# Patient Record
Sex: Male | Born: 1975 | Race: White | Hispanic: No | Smoking: Former smoker
Health system: Southern US, Community
[De-identification: ages and names within clinical notes are randomized; demographics above are authoritative.]

## PROBLEM LIST (undated history)

## (undated) DIAGNOSIS — K589 Irritable bowel syndrome without diarrhea: Secondary | ICD-10-CM

## (undated) DIAGNOSIS — K219 Gastro-esophageal reflux disease without esophagitis: Secondary | ICD-10-CM

## (undated) DIAGNOSIS — F32A Depression, unspecified: Secondary | ICD-10-CM

## (undated) DIAGNOSIS — K750 Abscess of liver: Secondary | ICD-10-CM

## (undated) DIAGNOSIS — F4310 Post-traumatic stress disorder, unspecified: Secondary | ICD-10-CM

## (undated) DIAGNOSIS — F419 Anxiety disorder, unspecified: Secondary | ICD-10-CM

## (undated) DIAGNOSIS — F329 Major depressive disorder, single episode, unspecified: Secondary | ICD-10-CM

## (undated) DIAGNOSIS — G4733 Obstructive sleep apnea (adult) (pediatric): Secondary | ICD-10-CM

## (undated) HISTORY — DX: Irritable bowel syndrome without diarrhea: K58.9

## (undated) HISTORY — DX: Major depressive disorder, single episode, unspecified: F32.9

## (undated) HISTORY — PX: LIVER SURGERY: SHX698

## (undated) HISTORY — DX: Obstructive sleep apnea (adult) (pediatric): G47.33

## (undated) HISTORY — DX: Morbid (severe) obesity due to excess calories: E66.01

## (undated) HISTORY — DX: Post-traumatic stress disorder, unspecified: F43.10

## (undated) HISTORY — DX: Depression, unspecified: F32.A

## (undated) HISTORY — DX: Gastro-esophageal reflux disease without esophagitis: K21.9

## (undated) HISTORY — DX: Anxiety disorder, unspecified: F41.9

---
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
## 2009-11-18 ENCOUNTER — Emergency Department (HOSPITAL_COMMUNITY): Admission: EM | Admit: 2009-11-18 | Discharge: 2009-11-18 | Payer: Self-pay | Admitting: Family Medicine

## 2011-01-15 ENCOUNTER — Emergency Department (HOSPITAL_COMMUNITY): Payer: Self-pay

## 2011-01-15 ENCOUNTER — Emergency Department (HOSPITAL_COMMUNITY)
Admission: EM | Admit: 2011-01-15 | Discharge: 2011-01-15 | Disposition: A | Discharge status: Home / Self Care | Payer: Self-pay | Attending: Emergency Medicine | Admitting: Emergency Medicine

## 2011-01-15 DIAGNOSIS — K589 Irritable bowel syndrome without diarrhea: Secondary | ICD-10-CM | POA: Insufficient documentation

## 2011-01-15 DIAGNOSIS — F329 Major depressive disorder, single episode, unspecified: Secondary | ICD-10-CM | POA: Insufficient documentation

## 2011-01-15 DIAGNOSIS — R109 Unspecified abdominal pain: Secondary | ICD-10-CM | POA: Insufficient documentation

## 2011-01-15 DIAGNOSIS — F3289 Other specified depressive episodes: Secondary | ICD-10-CM | POA: Insufficient documentation

## 2011-01-15 DIAGNOSIS — F411 Generalized anxiety disorder: Secondary | ICD-10-CM | POA: Insufficient documentation

## 2011-01-15 DIAGNOSIS — M546 Pain in thoracic spine: Secondary | ICD-10-CM | POA: Insufficient documentation

## 2011-01-15 DIAGNOSIS — R197 Diarrhea, unspecified: Secondary | ICD-10-CM | POA: Insufficient documentation

## 2011-01-15 DIAGNOSIS — R12 Heartburn: Secondary | ICD-10-CM | POA: Insufficient documentation

## 2011-01-15 DIAGNOSIS — R112 Nausea with vomiting, unspecified: Secondary | ICD-10-CM | POA: Insufficient documentation

## 2011-01-15 LAB — DIFFERENTIAL
Basophils Absolute: 0 10*3/uL (ref 0.0–0.1)
Basophils Relative: 0 % (ref 0–1)
Monocytes Absolute: 1.6 10*3/uL — ABNORMAL HIGH (ref 0.1–1.0)
Neutro Abs: 7.1 10*3/uL (ref 1.7–7.7)
Neutrophils Relative %: 53 % (ref 43–77)

## 2011-01-15 LAB — COMPREHENSIVE METABOLIC PANEL
ALT: 31 U/L (ref 0–53)
AST: 34 U/L (ref 0–37)
Albumin: 4.7 g/dL (ref 3.5–5.2)
CO2: 27 mEq/L (ref 19–32)
Calcium: 10 mg/dL (ref 8.4–10.5)
GFR calc Af Amer: >60 mL/min (ref >60)
GFR calc non Af Amer: >60 mL/min (ref >60)
Sodium: 137 mEq/L (ref 135–145)

## 2011-01-15 LAB — URINALYSIS, ROUTINE W REFLEX MICROSCOPIC
Nitrite: NEGATIVE
Specific Gravity, Urine: 1.036 — ABNORMAL HIGH (ref 1.005–1.030)
Urobilinogen, UA: 0.2 mg/dL (ref 0.0–1.0)

## 2011-01-15 LAB — CBC
Hemoglobin: 16.1 g/dL (ref 13.0–17.0)
MCHC: 34.8 g/dL (ref 30.0–36.0)
Platelets: 209 10*3/uL (ref 150–400)
RBC: 5.24 MIL/uL (ref 4.22–5.81)

## 2013-06-30 ENCOUNTER — Encounter: Payer: Self-pay | Admitting: Family Medicine

## 2013-06-30 ENCOUNTER — Ambulatory Visit (INDEPENDENT_AMBULATORY_CARE_PROVIDER_SITE_OTHER): Payer: No Typology Code available for payment source | Admitting: Family Medicine

## 2013-06-30 VITALS — BP 120/70 | HR 67 | Temp 97.9°F | Ht 69.0 in | Wt 294.8 lb

## 2013-06-30 DIAGNOSIS — Z13220 Encounter for screening for lipoid disorders: Secondary | ICD-10-CM

## 2013-06-30 DIAGNOSIS — G4733 Obstructive sleep apnea (adult) (pediatric): Secondary | ICD-10-CM

## 2013-06-30 DIAGNOSIS — K589 Irritable bowel syndrome without diarrhea: Secondary | ICD-10-CM

## 2013-06-30 DIAGNOSIS — K219 Gastro-esophageal reflux disease without esophagitis: Secondary | ICD-10-CM

## 2013-06-30 DIAGNOSIS — R5381 Other malaise: Secondary | ICD-10-CM

## 2013-06-30 DIAGNOSIS — F259 Schizoaffective disorder, unspecified: Secondary | ICD-10-CM

## 2013-06-30 LAB — CBC WITH DIFFERENTIAL/PLATELET
Basophils Absolute: 0 10*3/uL (ref 0.0–0.1)
Eosinophils Absolute: 0.3 10*3/uL (ref 0.0–0.7)
HCT: 42.3 % (ref 39.0–52.0)
Lymphs Abs: 3.9 10*3/uL (ref 0.7–4.0)
MCHC: 33.8 g/dL (ref 30.0–36.0)
MCV: 88.6 fl (ref 78.0–100.0)
Monocytes Absolute: 0.7 10*3/uL (ref 0.1–1.0)
Neutrophils Relative %: 54.5 % (ref 43.0–77.0)
Platelets: 249 10*3/uL (ref 150.0–400.0)
RDW: 14.1 % (ref 11.5–14.6)
WBC: 10.8 10*3/uL — ABNORMAL HIGH (ref 4.5–10.5)

## 2013-06-30 NOTE — Patient Instructions (Addendum)
REFERRAL: GO THE THE FRONT ROOM AT THE ENTRANCE OF OUR CLINIC, NEAR CHECK IN. ASK FOR Nicholas Bradford. SHE WILL HELP YOU SET UP YOUR REFERRAL. DATE: TIME:  

## 2013-06-30 NOTE — Progress Notes (Signed)
Nature conservation officer at [facility] [address] [place] [address] Phone: [phone] Fax: [phone]  Date:  06/30/2013   Name:  Nicholas Bradford   DOB:  [birth year]   MRN:  [id number] Gender: male Age: 37 y.o.  Primary Physician:  Hannah Beat, MD  Evaluating MD: Hannah Beat, MD   Chief Complaint: New Patient   History of Present Illness:  Nicholas Bradford is a 37 y.o. pleasant patient who presents with the following:  New patient:   Has been sick a lot and throwing up and ssleeping fairly poorly. Does have some IBS and it has been pretty intense. Was diagnosed with sleep apnea, and how to get osa machine improved. He had one and it helped a lot, and then it broke.   Lost job in [place] or June. Sleep has been all night --- all night. Napping all the time, breathing all the niht.   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   Diagnosed [facility] - schizoaffective disorder. Psychiatrist there. He also had a psychologist. Mother has bipolar disorder Brother ? Bipolar  ? Aunt bipolar.  Sometimes will have auditory hallucinations. Not as much lately.  No etoh Tried some prozac, also something. Had some klonopin. Took a couple ssri - felt worse.   Patient Active Problem List   Diagnosis Date Noted  . Schizoaffective disorder 07/01/2013  . GERD (gastroesophageal reflux disease) 07/01/2013  . Morbid obesity 07/01/2013  . Irritable bowel syndrome (IBS)     Past Medical History  Diagnosis Date  . GERD (gastroesophageal reflux disease)   . Schizoaffective disorder 07/01/2013  . Irritable bowel syndrome (IBS)   . Morbid obesity 07/01/2013    Past Surgical History  Procedure Laterality Date  . Appendectomy  1990    History   Social History  . Marital Status: Married    Spouse Name: N/A    Number of Children: 0  . Years of Education: N/A   Occupational History  .  [employer]   Social History Main Topics  . Smoking status: Never Smoker   . Smokeless  tobacco: Never Used  . Alcohol Use: 0.5 oz/week    1 drink(s) per week  . Drug Use: Yes    Special: Marijuana     Comment: sometimes / infrequent  . Sexual Activity: Yes    Partners: Female   Other Topics Concern  . Not on file   Social History Narrative   Now unemployed    Married    Family History  Problem Relation Age of Onset  . Breast cancer Mother   . Alcohol abuse Father   . Heart disease Paternal Grandfather     No Known Allergies  No current outpatient prescriptions on file prior to visit.   No current facility-administered medications on file prior to visit.     Review of Systems:  As above No fever Bad GERD and IBS symptoms Depressed, occ will have auditory hallucinations. No psych hosp. NO SG in past. Otherwise, the pertinent positives and negatives are listed above and in the HPI, otherwise a full review of systems has been reviewed and is negative unless noted positive.   Physical Examination: BP 120/70  Pulse 67  Temp(Src) 97.9 F (36.6 C) (Oral)  Ht 5\' 9"  (1.753 m)  Wt 294 lb 12 oz (133.698 kg)  BMI 43.51 kg/m2  Ideal Body Weight: Weight in (lb) to have BMI = 25: 168.9   GEN: WDWN, NAD, Non-toxic, A & O x 3 HEENT: Atraumatic,  Normocephalic. Neck supple. No masses, No LAD. Ears and Nose: No external deformity. CV: RRR, No M/G/R. No JVD. No thrill. No extra heart sounds. PULM: CTA B, no wheezes, crackles, rhonchi. No retractions. No resp. distress. No accessory muscle use. EXTR: No c/c/e NEURO Normal gait.  PSYCH: Normally interactive. Conversant. Not depressed or anxious appearing.  Calm demeanor.    Assessment and Plan:  Obstructive sleep apnea - Plan: Ambulatory referral to Pulmonology: prior dx, very symptomatic and needs help now.   Screening for lipoid disorders - Plan: LDL cholesterol, direct  Other malaise and fatigue - Plan: Basic metabolic panel, CBC with Differential, Hepatic function panel, TSH: like OSA driven, but may be  multifactorial  Schizoaffective disorder vs bipolar - psych appt within the next 2 weeks which is critical  GERD (gastroesophageal reflux disease) using ppi at home  Irritable bowel syndrome (IBS): reviewed care, add probiotics, fluids, fiber.   Morbid obesity  >45 minutes spent in face to face time with patient, >50% spent in counselling or coordination of care: Spent in discussion and review of all probs and psych history.   Orders Today:  Orders Placed This Encounter  Procedures  . Basic metabolic panel  . CBC with Differential  . Hepatic function panel  . TSH  . LDL cholesterol, direct  . Ambulatory referral to Pulmonology    Referral Priority:  Routine    Referral Type:  Consultation    Referral Reason:  Specialty Services Required    Requested Specialty:  Pulmonary Disease    Number of Visits Requested:  1    Updated Medication List: (Includes new medications, updates to list, dose adjustments) No orders of the defined types were placed in this encounter.    Medications Discontinued: There are no discontinued medications.    Signed, Elpidio Galea. Ryler Laskowski, MD 06/30/2013 2:23 PM

## 2013-07-01 ENCOUNTER — Encounter: Payer: Self-pay | Admitting: Family Medicine

## 2013-07-01 DIAGNOSIS — K219 Gastro-esophageal reflux disease without esophagitis: Secondary | ICD-10-CM | POA: Insufficient documentation

## 2013-07-01 DIAGNOSIS — G4733 Obstructive sleep apnea (adult) (pediatric): Secondary | ICD-10-CM | POA: Insufficient documentation

## 2013-07-01 DIAGNOSIS — K589 Irritable bowel syndrome without diarrhea: Secondary | ICD-10-CM | POA: Insufficient documentation

## 2013-07-01 DIAGNOSIS — F259 Schizoaffective disorder, unspecified: Secondary | ICD-10-CM | POA: Insufficient documentation

## 2013-07-01 HISTORY — DX: Morbid (severe) obesity due to excess calories: E66.01

## 2013-07-01 HISTORY — DX: Obstructive sleep apnea (adult) (pediatric): G47.33

## 2013-07-01 LAB — LDL CHOLESTEROL, DIRECT: Direct LDL: 121.8 mg/dL

## 2013-07-01 LAB — BASIC METABOLIC PANEL
BUN: 16 mg/dL (ref 6–23)
CO2: 25 mEq/L (ref 19–32)
Chloride: 105 mEq/L (ref 96–112)
Creatinine, Ser: 1 mg/dL (ref 0.4–1.5)
Glucose, Bld: 99 mg/dL (ref 70–99)

## 2013-07-01 LAB — HEPATIC FUNCTION PANEL
Bilirubin, Direct: 0.1 mg/dL (ref 0.0–0.3)
Total Bilirubin: 0.5 mg/dL (ref 0.3–1.2)

## 2013-07-01 LAB — TSH: TSH: 0.46 u[IU]/mL (ref 0.35–5.50)

## 2013-07-03 ENCOUNTER — Encounter: Payer: Self-pay | Admitting: *Deleted

## 2013-07-05 ENCOUNTER — Ambulatory Visit (HOSPITAL_COMMUNITY): Payer: Self-pay | Admitting: Psychiatry

## 2013-07-11 ENCOUNTER — Ambulatory Visit (INDEPENDENT_AMBULATORY_CARE_PROVIDER_SITE_OTHER): Payer: No Typology Code available for payment source | Admitting: Pulmonary Disease

## 2013-07-11 ENCOUNTER — Encounter: Payer: Self-pay | Admitting: Pulmonary Disease

## 2013-07-11 VITALS — BP 152/96 | HR 70 | Temp 98.6°F | Ht 71.0 in | Wt 283.0 lb

## 2013-07-11 DIAGNOSIS — G4733 Obstructive sleep apnea (adult) (pediatric): Secondary | ICD-10-CM

## 2013-07-11 NOTE — Patient Instructions (Signed)
Will order an auto device as a loaner for the next few weeks to optimize your pressure.  Will have your machine set on appropriate pressure once I receive the download.  Please call us if you have not heard anything about results within 2 weeks after download done. Will order new mask Work on weight loss If doing well, will see you back in 6mos, then yearly.

## 2013-07-11 NOTE — Progress Notes (Signed)
Subjective:    Patient ID: Nicholas Bradford, male    DOB: [birth year], 37 y.o.   MRN: [id number]  HPI The patient is a 37 year old male who I have been asked to see for management of obstructive sleep apnea.  The patient states he was diagnosed 4-5 years ago with significant sleep apnea, and was started on CPAP.  He was able to wear it for the first year, but he struggled with the device because of a feeling of smothering.  By his description, he did not have a split night study, nor did he have an auto titration at home he has been off the CPAP device for at least a few years.  He is now having worsening snoring, as well as witnessed apneas during the night.  He has frequent awakenings, and nonrestorative sleep.  He notes significant sleepiness during the day and evening with inactivity, and his Epworth score today is 19.  The patient states that his weight is up 60 pounds over the last few years.   Sleep Questionnaire What time do you typically go to bed?( Between what hours) 9p-12am 9p-12am at 1141 on 07/11/13 by Maisie Fus, CMA How long does it take you to fall asleep? 67min-3hours 46min-3hours at 1141 on 07/11/13 by Maisie Fus, CMA How many times during the night do you wake up? 5 5 at 1141 on 07/11/13 by Maisie Fus, CMA What time do you get out of bed to start your day? No Value 10am-12p at 1141 on 07/11/13 by Maisie Fus, CMA Do you drive or operate heavy machinery in your occupation? No No at 1141 on 07/11/13 by Maisie Fus, CMA How much has your weight changed (up or down) over the past two years? (In pounds) 60 lb (27.216 kg)60 lb (27.216 kg) increase at 1141 on 07/11/13 by Maisie Fus, CMA Have you ever had a sleep study before? Yes Yes at 1141 on 07/11/13 by Maisie Fus, CMA If yes, location of study? [facility] at 1141 on 07/11/13 by Maisie Fus, CMA If yes, date of study? 4-5 years ago 4-5 years ago at 1141 on 07/11/13 by Maisie Fus, CMA  Do you currently use CPAP? No No at 1141 on 07/11/13 by Maisie Fus, CMA Do you wear oxygen at any time? No No at 1141 on 07/11/13 by Maisie Fus, CMA   Review of Systems  Constitutional: Negative for fever and unexpected weight change.  HENT: Positive for rhinorrhea. Negative for congestion, dental problem, ear pain, nosebleeds, postnasal drip, sinus pressure, sneezing, sore throat and trouble swallowing.   Eyes: Negative for redness and itching.  Respiratory: Positive for cough. Negative for chest tightness, shortness of breath and wheezing.   Cardiovascular: Negative for palpitations and leg swelling.  Gastrointestinal: Positive for nausea and vomiting ( with cough).  Genitourinary: Negative for dysuria.  Musculoskeletal: Negative for joint swelling.  Skin: Negative for rash.  Neurological: Positive for headaches.  Hematological: Does not bruise/bleed easily.  Psychiatric/Behavioral: Positive for dysphoric mood. The patient is nervous/anxious.        Objective:   Physical Exam Constitutional:  Obese male, no acute distress  HENT:  Nares patent without discharge, mild septal deviation to the left with narrowing.  Oropharynx without exudate, palate and uvula are moderately elongated, mild tonsillar hypertrophy  Eyes:  Perrla, eomi, no scleral icterus  Neck:  No JVD, no TMG  Cardiovascular:  Normal rate, regular rhythm, no rubs or gallops.  No  murmurs        Intact distal pulses  Pulmonary :  Normal breath sounds, no stridor or respiratory distress   No rales, rhonchi, or wheezing  Abdominal:  Soft, nondistended, bowel sounds present.  No tenderness noted.   Musculoskeletal:  minimal lower extremity edema noted.  Lymph Nodes:  No cervical lymphadenopathy noted  Skin:  No cyanosis noted  Neurologic:  Alert, appropriate, moves all 4 extremities without obvious deficit.         Assessment & Plan:

## 2013-07-11 NOTE — Assessment & Plan Note (Signed)
The patient has a history of obstructive sleep apnea which was treated initially with CPAP.  From the patient's history, it sounds like his pressure was never optimized, and he quit using because of "smothering".  He is now having classic snoring and apnea at night, along with frequent awakenings and nonrestorative sleep.  This has led to severe sleepiness during the day, and significant impact on cognition and quality of life.  At this point, we need to get him started again on CPAP, and will optimize his pressure on an automatic device.  I have also encouraged him to work aggressively on weight loss.

## 2013-07-22 ENCOUNTER — Encounter (HOSPITAL_COMMUNITY): Payer: Self-pay | Admitting: Emergency Medicine

## 2013-07-22 ENCOUNTER — Emergency Department (HOSPITAL_COMMUNITY)
Admission: EM | Admit: 2013-07-22 | Discharge: 2013-07-22 | Disposition: A | Discharge status: Home / Self Care | Payer: No Typology Code available for payment source | Attending: Emergency Medicine | Admitting: Emergency Medicine

## 2013-07-22 DIAGNOSIS — K644 Residual hemorrhoidal skin tags: Secondary | ICD-10-CM | POA: Insufficient documentation

## 2013-07-22 DIAGNOSIS — Z87891 Personal history of nicotine dependence: Secondary | ICD-10-CM | POA: Insufficient documentation

## 2013-07-22 DIAGNOSIS — Z8659 Personal history of other mental and behavioral disorders: Secondary | ICD-10-CM | POA: Insufficient documentation

## 2013-07-22 DIAGNOSIS — K645 Perianal venous thrombosis: Secondary | ICD-10-CM

## 2013-07-22 DIAGNOSIS — R197 Diarrhea, unspecified: Secondary | ICD-10-CM | POA: Insufficient documentation

## 2013-07-22 DIAGNOSIS — Z8719 Personal history of other diseases of the digestive system: Secondary | ICD-10-CM | POA: Insufficient documentation

## 2013-07-22 MED ORDER — OXYCODONE-ACETAMINOPHEN 5-325 MG PO TABS
2.0000 | ORAL_TABLET | Freq: Once | ORAL | Status: AC
  Administered 2013-07-22: 2 via ORAL
  Filled 2013-07-22: qty 2

## 2013-07-22 MED ORDER — ONDANSETRON 4 MG PO TBDP: 4.0000 mg | ORAL_TABLET | Freq: Three times a day (TID) | ORAL | Status: DC | PRN

## 2013-07-22 MED ORDER — LORAZEPAM 1 MG PO TABS
1.0000 mg | ORAL_TABLET | Freq: Once | ORAL | Status: AC
  Administered 2013-07-22: 1 mg via ORAL
  Filled 2013-07-22: qty 1

## 2013-07-22 MED ORDER — SODIUM BICARBONATE 8.4 % IV SOLN
INTRAVENOUS | Status: AC
  Filled 2013-07-22: qty 50

## 2013-07-22 MED ORDER — OXYCODONE-ACETAMINOPHEN 5-325 MG PO TABS: 2.0000 | ORAL_TABLET | ORAL | Status: DC | PRN

## 2013-07-22 NOTE — Consult Note (Signed)
General surgery attending note:  I personally interviewed and examined this patient. He had large thrombosed external hemorrhoids with edema right anterior, right posterior, and left lateral.  Incision and evacuation of thrombosis was performed by Ms. Osborne under my supervision.  I agree with treatment plan and followup.   Angelia Mould. Derrell Lolling, M.D., [facility] General and Minimally invasive Surgery Breast and Colorectal Surgery Office:   [phone] Pager:   [phone]

## 2013-07-22 NOTE — ED Notes (Signed)
General surgery MD at bedside.

## 2013-07-22 NOTE — Consult Note (Signed)
  Nicholas Bradford Feb 12, 1976  [id number].   Requesting MD: Dr. Denton Lank Chief Complaint/Reason for Consult: thrombosed hemorrhoids HPI: This is a 37 yo white male who has had a GI bug for the last week.  He has had emesis and diarrhea.  The persistent diarrhea has caused some straining with BMs.  He began having some rectal pain yesterday.  It has progressively worsened.  He presented to the [facility] today for further evaluation.  He was found to have 3 large partially thrombosed hemorrhoids.  We have been asked to see him for further evaluation.  ROS: See HPI, otherwise all other systems are negative  Family History  Problem Relation Age of Onset  . Breast cancer Mother   . Alcohol abuse Father   . Heart disease Paternal Grandfather     Past Medical History  Diagnosis Date  . GERD (gastroesophageal reflux disease)   . Schizoaffective disorder 07/01/2013  . Irritable bowel syndrome (IBS)   . Morbid obesity 07/01/2013  . Obstructive sleep apnea 07/01/2013    Past Surgical History  Procedure Laterality Date  . Appendectomy  1990    Social History:  reports that he quit smoking about 17 years ago. His smoking use included Cigarettes. He has a .075 pack-year smoking history. He has never used smokeless tobacco. He reports that he drinks about 0.5 ounces of alcohol per week. He reports that he uses illicit drugs (Marijuana).  Allergies: No Known Allergies   (Not in a hospital admission)  Blood pressure 148/93, pulse 117, temperature 98.3 F (36.8 C), temperature source Oral, resp. rate 20, SpO2 99.00%. Physical Exam: General: pleasant, obese white male who is laying in bed in NAD Abd: soft, NT, ND, +BS, no masses, hernias, or organomegaly, obese Rectal: 3 large swollen partially thrombosed hemorrhoids.  Tender to palpation.  All these were prepped with betadine.  They were then injected with 1% lidocaine with epi and a couple of ccs of Na Bicarb was used to buffer the sting.  After these  were anesthetized, scissors were used to make elliptical incisions in each of these 3 hemorrhoids.  Clot was evacuated.  Very minimal bleeding.  Hemostasis was achieved.  The patient tolerated this well.   Psych: A&Ox3 with an appropriate affect.    No results found for this or any previous visit (from the past 48 hour(s)). No results found.     Assessment/Plan 1. Large partially thrombosed hemorrhoids 2. Gastroenteritis  Plan: 1. Incision and drainage of partially thrombosed hemorrhoids completed.  Patient feels much better already.  He is stable for dc home.  He will need to do stiz baths TID at home.  He will need to stay off of his feet for a couple days to help the swelling go down as well.  He should follow up with [place] in our office in 3 weeks.  Marcas Bowsher E 07/22/2013, 1:18 PM Pager: [phone]

## 2013-07-22 NOTE — ED Provider Notes (Signed)
CSN: [id number]     Arrival date & time 07/22/13  1011 History   First MD Initiated Contact with Patient 07/22/13 1021     Chief Complaint  Patient presents with  . Hemorrhoids   (Consider location/radiation/quality/duration/timing/severity/associated sxs/prior Treatment) HPI Comments: Patient is a 37 year old male with a past medical history of hemorrhoids who presents with a 2 day history of rectal pain. Symptoms started gradually and progressively worsened after a stomach flu where patient was having persistent diarrhea. The pain is sharp and severe without radiation. Palpation of the area and sitting makes the pain worse. No alleviating factors. Patient haas tried sitz baths for relief.    Past Medical History  Diagnosis Date  . GERD (gastroesophageal reflux disease)   . Schizoaffective disorder 07/01/2013  . Irritable bowel syndrome (IBS)   . Morbid obesity 07/01/2013  . Obstructive sleep apnea 07/01/2013   Past Surgical History  Procedure Laterality Date  . Appendectomy  1990   Family History  Problem Relation Age of Onset  . Breast cancer Mother   . Alcohol abuse Father   . Heart disease Paternal Grandfather    History  Substance Use Topics  . Smoking status: Former Smoker -- 0.25 packs/day for .3 years    Types: Cigarettes    Quit date: 09/30/1995  . Smokeless tobacco: Never Used     Comment: Pt only smoked x 3-4 months total. 2nd hand smoke exposure x 30+years.  . Alcohol Use: 0.5 oz/week    1 drink(s) per week    Review of Systems  Genitourinary:       Hemorrhoids  All other systems reviewed and are negative.    Allergies  Review of patient's allergies indicates no known allergies.  Home Medications   Current Outpatient Rx  Name  Route  Sig  Dispense  Refill  . promethazine (PHENERGAN) 12.5 MG tablet   Oral   Take 12.5 mg by mouth every 6 (six) hours as needed for nausea.          BP 148/93  Pulse 117  Temp(Src) 98.3 F (36.8 C) (Oral)  Resp 20   SpO2 99% Physical Exam  Nursing note and vitals reviewed. Constitutional: He is oriented to person, place, and time. He appears well-developed and well-nourished. No distress.  HENT:  Head: Normocephalic and atraumatic.  Eyes: Conjunctivae are normal.  Neck: Normal range of motion.  Cardiovascular: Normal rate and regular rhythm.  Exam reveals no gallop and no friction rub.   No murmur heard. Pulmonary/Chest: Effort normal and breath sounds normal. He has no wheezes. He has no rales. He exhibits no tenderness.  Abdominal: Soft. There is no tenderness.  Genitourinary:  3 large external hemorrhoids that are tender to palpation and slightly purple in color.   Musculoskeletal: Normal range of motion.  Neurological: He is alert and oriented to person, place, and time.  Speech is goal-oriented. Moves limbs without ataxia.   Skin: Skin is warm and dry.  Psychiatric: He has a normal mood and affect. His behavior is normal.    ED Course  Procedures (including critical care time) Labs Review Labs Reviewed - No data to display Imaging Review No results found.  EKG Interpretation   None       MDM   1. External hemorrhoids     1:19 PM Patient was given Percocet and Ativan here for pain and nausea. General surgery drained the hemorrhoids at the bedside. Patient will be discharged with pain medication and surgery  follow up. No further evaluation needed at this time.   Emilia Beck, PA-C 07/22/13 1946

## 2013-07-26 ENCOUNTER — Emergency Department (HOSPITAL_COMMUNITY)
Admission: EM | Admit: 2013-07-26 | Discharge: 2013-07-26 | Disposition: A | Discharge status: Home / Self Care | Payer: No Typology Code available for payment source | Attending: Emergency Medicine | Admitting: Emergency Medicine

## 2013-07-26 ENCOUNTER — Encounter (HOSPITAL_COMMUNITY): Payer: Self-pay | Admitting: Emergency Medicine

## 2013-07-26 DIAGNOSIS — K644 Residual hemorrhoidal skin tags: Secondary | ICD-10-CM | POA: Insufficient documentation

## 2013-07-26 DIAGNOSIS — Z4801 Encounter for change or removal of surgical wound dressing: Secondary | ICD-10-CM | POA: Insufficient documentation

## 2013-07-26 DIAGNOSIS — Z87891 Personal history of nicotine dependence: Secondary | ICD-10-CM | POA: Insufficient documentation

## 2013-07-26 DIAGNOSIS — Z8659 Personal history of other mental and behavioral disorders: Secondary | ICD-10-CM | POA: Insufficient documentation

## 2013-07-26 DIAGNOSIS — Z5189 Encounter for other specified aftercare: Secondary | ICD-10-CM

## 2013-07-26 DIAGNOSIS — K649 Unspecified hemorrhoids: Secondary | ICD-10-CM

## 2013-07-26 DIAGNOSIS — K5900 Constipation, unspecified: Secondary | ICD-10-CM | POA: Insufficient documentation

## 2013-07-26 DIAGNOSIS — Z8669 Personal history of other diseases of the nervous system and sense organs: Secondary | ICD-10-CM | POA: Insufficient documentation

## 2013-07-26 MED ORDER — HYDROCORTISONE 2.5 % RE CREA: TOPICAL_CREAM | Freq: Two times a day (BID) | RECTAL | Status: DC

## 2013-07-26 MED ORDER — OXYCODONE-ACETAMINOPHEN 5-325 MG PO TABS: 1.0000 | ORAL_TABLET | ORAL | Status: DC | PRN

## 2013-07-26 NOTE — ED Provider Notes (Signed)
CSN: [id number]     Arrival date & time 07/26/13  1356 History   None    Chief Complaint  Patient presents with  . Hemorrhoids    HPI  Nicholas Bradford is a 37 y.o. male with a PMH of GERD, schizoaffective disorder, IBS, OSA, and morbid obestiy who presents to the ED for evaluation of hemorrhoids.  History was provided by the patient.  Patient had external hemorrhoids lanced by general surgery in the ED on 07/22/13.  He states that he was provided Percocet and Zofran for pain which he has been taking.  He states he has had some bloody drainage but denies any purulent drainage.  He states he has some rectal pain but has not had any acute changes.  He states he has noticed a foul odor coming from his wound.  He has now had a bowel movement since 07/22/13.  He states that three days prior to his previous visit he had constant diarrhea, which he thinks cause his hemorrhoids.  He denies any abdominal pain, nausea, emesis, dysuria, hematuria, fever, chills, or change in appetite/activity.  He has not followed up with anything since discharge.      Past Medical History  Diagnosis Date  . GERD (gastroesophageal reflux disease)   . Schizoaffective disorder 07/01/2013  . Irritable bowel syndrome (IBS)   . Morbid obesity 07/01/2013  . Obstructive sleep apnea 07/01/2013   Past Surgical History  Procedure Laterality Date  . Appendectomy  1990   Family History  Problem Relation Age of Onset  . Breast cancer Mother   . Alcohol abuse Father   . Heart disease Paternal Grandfather    History  Substance Use Topics  . Smoking status: Former Smoker -- 0.25 packs/day for .3 years    Types: Cigarettes    Quit date: 09/30/1995  . Smokeless tobacco: Never Used     Comment: Pt only smoked x 3-4 months total. 2nd hand smoke exposure x 30+years.  . Alcohol Use: 0.5 oz/week    1 drink(s) per week    Review of Systems  Constitutional: Negative for fever, chills, diaphoresis, activity change, appetite change  and fatigue.  HENT: Negative for mouth sores and rhinorrhea.   Respiratory: Negative for cough and shortness of breath.   Cardiovascular: Negative for chest pain and leg swelling.  Gastrointestinal: Positive for constipation, anal bleeding and rectal pain. Negative for nausea, vomiting, abdominal pain, diarrhea, blood in stool and abdominal distention.  Genitourinary: Negative for dysuria, hematuria and difficulty urinating.  Musculoskeletal: Negative for back pain and gait problem.  Skin: Positive for wound (post-op).  Neurological: Negative for dizziness, weakness, light-headedness and headaches.    Allergies  Review of patient's allergies indicates no known allergies.  Home Medications   Current Outpatient Rx  Name  Route  Sig  Dispense  Refill  . ondansetron (ZOFRAN ODT) 4 MG disintegrating tablet   Oral   Take 1 tablet (4 mg total) by mouth every 8 (eight) hours as needed for nausea.   10 tablet   0   . oxyCODONE-acetaminophen (PERCOCET/ROXICET) 5-325 MG per tablet   Oral   Take 2 tablets by mouth every 4 (four) hours as needed for pain.   40 tablet   0   . promethazine (PHENERGAN) 12.5 MG tablet   Oral   Take 12.5 mg by mouth every 6 (six) hours as needed for nausea.          BP 140/87  Pulse 90  Temp(Src) 98 F (  36.7 C) (Oral)  Resp 14  Ht 5\' 11"  (1.803 m)  Wt 279 lb (126.554 kg)  BMI 38.93 kg/m2  SpO2 97%  Filed Vitals:   07/26/13 1412 07/26/13 1631  BP: 140/87 125/91  Pulse: 90 94  Temp: 98 F (36.7 C) 98.6 F (37 C)  TempSrc: Oral Oral  Resp: 14 16  Height: 5\' 11"  (1.803 m)   Weight: 279 lb (126.554 kg)   SpO2: 97% 97%    Physical Exam  Nursing note and vitals reviewed. Constitutional: He is oriented to person, place, and time. He appears well-developed and well-nourished. No distress.  HENT:  Head: Normocephalic and atraumatic.  Right Ear: External ear normal.  Left Ear: External ear normal.  Nose: Nose normal.  Mouth/Throat: Oropharynx  is clear and moist.  Eyes: Conjunctivae are normal. Right eye exhibits no discharge. Left eye exhibits no discharge.  Neck: Normal range of motion. Neck supple.  Cardiovascular: Normal rate, regular rhythm and normal heart sounds.  Exam reveals no gallop and no friction rub.   No murmur heard. Pulmonary/Chest: Effort normal and breath sounds normal. No respiratory distress. He has no wheezes. He has no rales. He exhibits no tenderness.  Abdominal: Soft. Bowel sounds are normal. He exhibits no distension and no mass. There is no tenderness. There is no rebound and no guarding.  Genitourinary:  Three external 1 cm x 1 cm hemorrhoids present at the 10:00, 2:00 and 6:00 position.  Open wounds with clotted blood present in the wound opening.  No evidence of purulent drainage or active bleeding. Mild erythema surrounding skin of the anus.    Musculoskeletal: Normal range of motion. He exhibits no edema and no tenderness.  Neurological: He is alert and oriented to person, place, and time.  Skin: Skin is warm and dry. He is not diaphoretic.    ED Course  Procedures (including critical care time) Labs Review Labs Reviewed - No data to display Imaging Review No results found.  EKG Interpretation   None       MDM   1. Hemorrhoids   2. Encounter for wound re-check     Nicholas Bradford is a 37 y.o. male with a PMH of GERD, schizoaffective disorder, IBS, OSA, and morbid obestiy who presents to the ED for evaluation of hemorrhoids.   Patient evaluated for wound check following excision of hemorrhoids in the ED on 07/22/13.  Wounds appear to be healing adequately with no external signs of infection at this time.  Patient was given a prescription for Percocet for continued pain control and Anusol.  He was instructed to take Colace stool softener and try Miralax if he does not have a bowel movement in 24 hours.  Patient afebrile, non-toxic in appearance, and his abdominal exam was benign.  He was  instructed to follow-up with the surgeon who performed his procedure in the ED.  He was instructed to return if he has any fever, drainage of pus, spreading redness/swelling, perfuse bleeding, worsening pain, severe abdominal pain, repeated vomiting, inability to have a bowel movement, or other concerns.  Patient in agreement with discharge and plan.     Final impressions: 1. Hemorrhoids  2. Encounter for wound re-check     Luiz Iron PA-C   This patient was discussed with Dr. Rolland Porter   Jillyn Ledger, PA-C 07/28/13 1239

## 2013-07-26 NOTE — ED Notes (Signed)
MD at bedside. 

## 2013-07-26 NOTE — ED Notes (Signed)
Pt wants to be re-evaluated for his external hemorrhoids having to be lanced bc he is still having bleeding, severe pain and no BM since Friday. Pt states he is changing out the guaze like he was instructed and the odor is very bad.

## 2013-07-26 NOTE — ED Provider Notes (Signed)
Medical screening examination/treatment/procedure(s) were conducted as a shared visit with non-physician practitioner(s) and myself.  I personally evaluated the patient during the encounter.  EKG Interpretation   None       Pt c/o rectal pain. Has 3 thrombosed ext hemorrhoids. No abscess. No bleeding.   Suzi Roots, MD 07/26/13 [phone]

## 2013-07-28 ENCOUNTER — Emergency Department (HOSPITAL_COMMUNITY)
Admission: EM | Admit: 2013-07-28 | Discharge: 2013-07-28 | Disposition: A | Discharge status: Home / Self Care | Payer: No Typology Code available for payment source | Attending: Emergency Medicine | Admitting: Emergency Medicine

## 2013-07-28 ENCOUNTER — Encounter (HOSPITAL_COMMUNITY): Payer: Self-pay | Admitting: Emergency Medicine

## 2013-07-28 DIAGNOSIS — R112 Nausea with vomiting, unspecified: Secondary | ICD-10-CM | POA: Insufficient documentation

## 2013-07-28 DIAGNOSIS — Z8719 Personal history of other diseases of the digestive system: Secondary | ICD-10-CM | POA: Insufficient documentation

## 2013-07-28 DIAGNOSIS — K5900 Constipation, unspecified: Secondary | ICD-10-CM | POA: Insufficient documentation

## 2013-07-28 DIAGNOSIS — Z87891 Personal history of nicotine dependence: Secondary | ICD-10-CM | POA: Insufficient documentation

## 2013-07-28 DIAGNOSIS — Z8669 Personal history of other diseases of the nervous system and sense organs: Secondary | ICD-10-CM | POA: Insufficient documentation

## 2013-07-28 DIAGNOSIS — Z9089 Acquired absence of other organs: Secondary | ICD-10-CM | POA: Insufficient documentation

## 2013-07-28 DIAGNOSIS — IMO0002 Reserved for concepts with insufficient information to code with codable children: Secondary | ICD-10-CM | POA: Insufficient documentation

## 2013-07-28 DIAGNOSIS — K644 Residual hemorrhoidal skin tags: Secondary | ICD-10-CM | POA: Insufficient documentation

## 2013-07-28 DIAGNOSIS — R42 Dizziness and giddiness: Secondary | ICD-10-CM | POA: Insufficient documentation

## 2013-07-28 DIAGNOSIS — Z8659 Personal history of other mental and behavioral disorders: Secondary | ICD-10-CM | POA: Insufficient documentation

## 2013-07-28 MED ORDER — NAPROXEN 500 MG PO TABS: 500.0000 mg | ORAL_TABLET | Freq: Two times a day (BID) | ORAL | Status: DC

## 2013-07-28 MED ORDER — POLYETHYLENE GLYCOL 3350 17 G PO PACK: 17.0000 g | PACK | Freq: Every day | ORAL | Status: DC

## 2013-07-28 MED ORDER — ONDANSETRON 4 MG PO TBDP: 4.0000 mg | ORAL_TABLET | Freq: Three times a day (TID) | ORAL | Status: DC | PRN

## 2013-07-28 MED ORDER — OXYCODONE-ACETAMINOPHEN 5-325 MG PO TABS: 2.0000 | ORAL_TABLET | ORAL | Status: DC | PRN

## 2013-07-28 NOTE — ED Provider Notes (Signed)
CSN: [id number]     Arrival date & time 07/28/13  1637 History  This chart was scribed for non-physician practitioner, Fayrene Helper PA-C, working with Shon Baton, MD, by Yevette Edwards, ED Scribe. This patient was seen in room WTR7/WTR7 and the patient's care was started at 6:16 PM.  First MD Initiated Contact with Patient 07/28/13 1813     Chief Complaint  Patient presents with  . Hemorrhoids   (Consider location/radiation/quality/duration/timing/severity/associated sxs/prior Treatment) The history is provided by the patient. No language interpreter was used.   HPI Comments: Nicholas Bradford is a 37 y.o. male, with a h/o IBS, who presents to the Emergency Department complaining of gradually increasing pain associated with external hemorrhoids. He reports that he had a "large" BM toda,y which was his first BM in a week, and that he has experienced increased pain since the BM. He also reports that he is continuing to experience rectal bleeding. He states that he has used witch hazel pads to blot after the BM, and he is concerned that there is both blood and feces present after blotting. He reports that he has been using prescribed percocet, but he finished the last percocet today, and he requests an additional medication stating the pain is preventing  The pt has also taken Zofran, sitz baths,  and Miramax to mitigate symptoms. He reports that today he also experienced lightheadedness and nausea, but he contributes both to the increasing pain. He denies experiencing a fever. He is a former smoker.   Past Medical History  Diagnosis Date  . GERD (gastroesophageal reflux disease)   . Schizoaffective disorder 07/01/2013  . Irritable bowel syndrome (IBS)   . Morbid obesity 07/01/2013  . Obstructive sleep apnea 07/01/2013   Past Surgical History  Procedure Laterality Date  . Appendectomy  1990   Family History  Problem Relation Age of Onset  . Breast cancer Mother   . Alcohol abuse Father   .  Heart disease Paternal Grandfather    History  Substance Use Topics  . Smoking status: Former Smoker -- 0.25 packs/day for .3 years    Types: Cigarettes    Quit date: 09/30/1995  . Smokeless tobacco: Never Used     Comment: Pt only smoked x 3-4 months total. 2nd hand smoke exposure x 30+years.  . Alcohol Use: 0.5 oz/week    1 drink(s) per week    Review of Systems  Constitutional: Negative for fever.  Gastrointestinal: Positive for nausea, vomiting, constipation and blood in stool.  Neurological: Positive for light-headedness.    Allergies  Review of patient's allergies indicates no known allergies.  Home Medications   Current Outpatient Rx  Name  Route  Sig  Dispense  Refill  . hydrocortisone (PROCTOZONE-HC) 2.5 % rectal cream   Rectal   Place rectally 2 (two) times daily.   30 g   0   . ondansetron (ZOFRAN ODT) 4 MG disintegrating tablet   Oral   Take 1 tablet (4 mg total) by mouth every 8 (eight) hours as needed for nausea.   10 tablet   0   . oxyCODONE-acetaminophen (PERCOCET/ROXICET) 5-325 MG per tablet   Oral   Take 2 tablets by mouth every 4 (four) hours as needed for pain.   40 tablet   0   . oxyCODONE-acetaminophen (PERCOCET/ROXICET) 5-325 MG per tablet   Oral   Take 1-2 tablets by mouth every 4 (four) hours as needed for pain.   15 tablet   0  Triage Vitals: BP 155/77  Pulse 94  Temp(Src) 99.2 F (37.3 C) (Oral)  Resp 20  SpO2 95%  Physical Exam  Nursing note and vitals reviewed. Constitutional: He is oriented to person, place, and time. He appears well-developed and well-nourished. No distress.  Appears uncomfortable   HENT:  Head: Normocephalic and atraumatic.  Eyes: EOM are normal.  Neck: Neck supple. No tracheal deviation present.  Pulmonary/Chest: Effort normal. No respiratory distress.  Genitourinary:  Evidence of large external hemorrhoid with an ulceration overlying the hemorrohid at the 9 o'clock position. There is moderate  tenderness. There is no evidence of thrombosis.   Musculoskeletal: Normal range of motion.  Neurological: He is alert and oriented to person, place, and time.  Skin: Skin is warm and dry.  Psychiatric: He has a normal mood and affect. His behavior is normal.    ED Course  Procedures (including critical care time)  DIAGNOSTIC STUDIES: Oxygen Saturation is 95% on room air, normal by my interpretation.    COORDINATION OF CARE:  6:20 PM- Discussed treatment plan with patient, and the patient agreed to the plan. Informed the pt that continual narcotic usage could increase constipation and prolong the symptoms of hemorrhoids. Advised pt to take naproxin in addition to the narcotic usage, but warned the pt to use the narcotics sparingly and only when absolutely necessary. Encouraged the pt to continue using Miramax.   Labs Review Labs Reviewed - No data to display Imaging Review No results found.  EKG Interpretation   None       MDM   1. External hemorrhoid, bleeding    BP 155/77  Pulse 94  Temp(Src) 99.2 F (37.3 C) (Oral)  Resp 20  SpO2 95%   I personally performed the services described in this documentation, which was scribed in my presence. The recorded information has been reviewed and is accurate.    Fayrene Helper, PA-C 07/28/13 [phone]

## 2013-07-28 NOTE — ED Provider Notes (Signed)
Medical screening examination/treatment/procedure(s) were performed by non-physician practitioner and as supervising physician I was immediately available for consultation/collaboration.  EKG Interpretation   None        Courtney F Horton, MD 07/28/13 2319 

## 2013-07-28 NOTE — ED Notes (Signed)
Pt states he has hemorrhoids, on Friday he was prescribed percocet, came back Tuesday and was given hydrocortisone cream. Ran out of pain meds. States he has "large bowel movement" today, pain more intense. Bright red blood noted in stool. Has been using sitz baths which helps minimally with pain relief.

## 2013-07-30 NOTE — ED Provider Notes (Signed)
Medical screening examination/treatment/procedure(s) were performed by non-physician practitioner and as supervising physician I was immediately available for consultation/collaboration.  EKG Interpretation   None         Roney Marion, MD 07/30/13 1116

## 2013-08-01 ENCOUNTER — Emergency Department (HOSPITAL_COMMUNITY)
Admission: EM | Admit: 2013-08-01 | Discharge: 2013-08-01 | Disposition: A | Discharge status: Home / Self Care | Payer: No Typology Code available for payment source | Attending: Emergency Medicine | Admitting: Emergency Medicine

## 2013-08-01 ENCOUNTER — Encounter (HOSPITAL_COMMUNITY): Payer: Self-pay | Admitting: Emergency Medicine

## 2013-08-01 ENCOUNTER — Emergency Department (HOSPITAL_COMMUNITY): Payer: No Typology Code available for payment source

## 2013-08-01 DIAGNOSIS — Z8659 Personal history of other mental and behavioral disorders: Secondary | ICD-10-CM | POA: Insufficient documentation

## 2013-08-01 DIAGNOSIS — Z791 Long term (current) use of non-steroidal anti-inflammatories (NSAID): Secondary | ICD-10-CM | POA: Insufficient documentation

## 2013-08-01 DIAGNOSIS — Z8669 Personal history of other diseases of the nervous system and sense organs: Secondary | ICD-10-CM | POA: Insufficient documentation

## 2013-08-01 DIAGNOSIS — R109 Unspecified abdominal pain: Secondary | ICD-10-CM | POA: Insufficient documentation

## 2013-08-01 DIAGNOSIS — R61 Generalized hyperhidrosis: Secondary | ICD-10-CM | POA: Insufficient documentation

## 2013-08-01 DIAGNOSIS — Z87891 Personal history of nicotine dependence: Secondary | ICD-10-CM | POA: Insufficient documentation

## 2013-08-01 DIAGNOSIS — R112 Nausea with vomiting, unspecified: Secondary | ICD-10-CM | POA: Insufficient documentation

## 2013-08-01 DIAGNOSIS — IMO0002 Reserved for concepts with insufficient information to code with codable children: Secondary | ICD-10-CM | POA: Insufficient documentation

## 2013-08-01 DIAGNOSIS — R197 Diarrhea, unspecified: Secondary | ICD-10-CM | POA: Insufficient documentation

## 2013-08-01 DIAGNOSIS — Z9089 Acquired absence of other organs: Secondary | ICD-10-CM | POA: Insufficient documentation

## 2013-08-01 DIAGNOSIS — Z8719 Personal history of other diseases of the digestive system: Secondary | ICD-10-CM | POA: Insufficient documentation

## 2013-08-01 LAB — CBC WITH DIFFERENTIAL/PLATELET
Basophils Absolute: 0 10*3/uL (ref 0.0–0.1)
Basophils Relative: 0 % (ref 0–1)
Eosinophils Absolute: 0 10*3/uL (ref 0.0–0.7)
HCT: 35.5 % — ABNORMAL LOW (ref 39.0–52.0)
Hemoglobin: 12.3 g/dL — ABNORMAL LOW (ref 13.0–17.0)
Lymphocytes Relative: 18 % (ref 12–46)
MCHC: 34.6 g/dL (ref 30.0–36.0)
Monocytes Relative: 14 % — ABNORMAL HIGH (ref 3–12)
Neutro Abs: 11.7 10*3/uL — ABNORMAL HIGH (ref 1.7–7.7)
Neutrophils Relative %: 68 % (ref 43–77)
RDW: 13.8 % (ref 11.5–15.5)
WBC: 17.2 10*3/uL — ABNORMAL HIGH (ref 4.0–10.5)

## 2013-08-01 LAB — COMPREHENSIVE METABOLIC PANEL
ALT: 26 U/L (ref 0–53)
AST: 19 U/L (ref 0–37)
Alkaline Phosphatase: 93 U/L (ref 39–117)
Calcium: 9.7 mg/dL (ref 8.4–10.5)
Glucose, Bld: 129 mg/dL — ABNORMAL HIGH (ref 70–99)
Potassium: 3.4 mEq/L — ABNORMAL LOW (ref 3.5–5.1)
Sodium: 129 mEq/L — ABNORMAL LOW (ref 135–145)
Total Protein: 8.3 g/dL (ref 6.0–8.3)

## 2013-08-01 LAB — PATHOLOGIST SMEAR REVIEW

## 2013-08-01 MED ORDER — PROMETHAZINE HCL 25 MG/ML IJ SOLN
25.0000 mg | Freq: Once | INTRAMUSCULAR | Status: AC
  Administered 2013-08-01: 25 mg via INTRAVENOUS
  Filled 2013-08-01: qty 1

## 2013-08-01 MED ORDER — SODIUM CHLORIDE 0.9 % IV BOLUS (SEPSIS)
1000.0000 mL | Freq: Once | INTRAVENOUS | Status: AC
  Administered 2013-08-01: 1000 mL via INTRAVENOUS

## 2013-08-01 MED ORDER — ONDANSETRON HCL 4 MG/2ML IJ SOLN
4.0000 mg | Freq: Once | INTRAMUSCULAR | Status: AC
  Administered 2013-08-01: 4 mg via INTRAVENOUS
  Filled 2013-08-01: qty 2

## 2013-08-01 MED ORDER — SODIUM CHLORIDE 0.9 % IV BOLUS (SEPSIS): 1000.0000 mL | Freq: Once | INTRAVENOUS | Status: DC

## 2013-08-01 MED ORDER — FAMOTIDINE 40 MG PO TABS: 20.0000 mg | ORAL_TABLET | Freq: Two times a day (BID) | ORAL | Status: DC | PRN

## 2013-08-01 MED ORDER — PROMETHAZINE HCL 25 MG PO TABS: 25.0000 mg | ORAL_TABLET | Freq: Four times a day (QID) | ORAL | Status: DC | PRN

## 2013-08-01 NOTE — ED Notes (Signed)
Pt has water at bedside, encouraged pt to drink slowly and let us know how he feels.

## 2013-08-01 NOTE — ED Notes (Signed)
Pt states he has been vomiting "more than 20 times a day" since Saturday. Pt has also been experiencing nausea and diarrhea. The pt was given pain medications last Thursday at [facility] for hemroid pain, which he stopped taking on Saturday due to the pain being relieved. He was also prescribed zofran, which he states has provided minor relief of his nausea. Pt states he also has 5/10 abdominal pain.

## 2013-08-01 NOTE — ED Provider Notes (Signed)
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 male complaining of N/V/D x3 days, patient has had Zofran, Phenergan and is getting a second Zofran dose. Plan is to followup by mouth challenge.   Patient seen and examined at the bedside, he has been drinking liquids with no emesis. Lung sounds are clear to auscultation bilaterally heart is regular rate and rhythm with no murmurs rubs or gallops abdominal exam is benign with mild diffuse tenderness to palpation and no guarding or rebound.  Pt is hemodynamically stable, appropriate for, and amenable to discharge at this time. All questions answered. Pt verbalized understanding and agrees with care plan. Outpatient follow-up and specific return precautions discussed.      Medication List    TAKE these medications       famotidine 40 MG tablet  Commonly known as:  PEPCID  Take 0.5 tablets (20 mg total) by mouth 2 (two) times daily as needed for heartburn. OTC     promethazine 25 MG tablet  Commonly known as:  PHENERGAN  Take 1 tablet (25 mg total) by mouth every 6 (six) hours as needed for nausea.      ASK your doctor about these medications       hydrocortisone 2.5 % rectal cream  Commonly known as:  PROCTOZONE-HC  Place rectally 2 (two) times daily.     naproxen 500 MG tablet  Commonly known as:  NAPROSYN  Take 1 tablet (500 mg total) by mouth 2 (two) times daily.     ondansetron 4 MG disintegrating tablet  Commonly known as:  ZOFRAN ODT  Take 1 tablet (4 mg total) by mouth every 8 (eight) hours as needed for nausea.     oxyCODONE-acetaminophen 5-325 MG per tablet  Commonly known as:  PERCOCET/ROXICET  Take 2 tablets by mouth every 4 (four) hours as needed for pain.         Wynetta Emery, PA-C 08/01/13 1717

## 2013-08-01 NOTE — ED Provider Notes (Signed)
CSN: [id number]     Arrival date & time 08/01/13  1252 History   First MD Initiated Contact with Patient 08/01/13 1432     Chief Complaint  Patient presents with  . Emesis   (Consider location/radiation/quality/duration/timing/severity/associated sxs/prior Treatment) HPI Pt is a 37yo male with hx of GERD and IBS c/o persistent vomiting and diarrhea since Saturday, 11/1.  States he has had 10-20 episodes of NBNB emesis and 10-20 episodes of watery diarrhea mixed with loose stools, no blood or mucous in stool.  Pt states he has not been able to keep down much food and has tried to eat broth as well as drink liquids with electrolytes. Pt states he feels like he is losing weight but does not know how much, states wife notices him appearing thinner.  Reports mild generalized abdominal pain, described as cramping, "like hunger pains" 4/10. Has tried zofran at home with minimal relief. Denies fever. Denies recent travel including camping. Denies sick contacts or recent antibiotic use.    Past Medical History  Diagnosis Date  . GERD (gastroesophageal reflux disease)   . Schizoaffective disorder 07/01/2013  . Irritable bowel syndrome (IBS)   . Morbid obesity 07/01/2013  . Obstructive sleep apnea 07/01/2013   Past Surgical History  Procedure Laterality Date  . Appendectomy  1990   Family History  Problem Relation Age of Onset  . Breast cancer Mother   . Alcohol abuse Father   . Heart disease Paternal Grandfather    History  Substance Use Topics  . Smoking status: Former Smoker -- 0.25 packs/day for .3 years    Types: Cigarettes    Quit date: 09/30/1995  . Smokeless tobacco: Never Used     Comment: Pt only smoked x 3-4 months total. 2nd hand smoke exposure x 30+years.  . Alcohol Use: 0.5 oz/week    1 drink(s) per week    Review of Systems  Constitutional: Positive for diaphoresis. Negative for fever, chills and fatigue.  Gastrointestinal: Positive for nausea, vomiting, abdominal pain and  diarrhea. Negative for constipation and blood in stool.  All other systems reviewed and are negative.    Allergies  Review of patient's allergies indicates no known allergies.  Home Medications   Current Outpatient Rx  Name  Route  Sig  Dispense  Refill  . hydrocortisone (PROCTOZONE-HC) 2.5 % rectal cream   Rectal   Place rectally 2 (two) times daily.   30 g   0   . naproxen (NAPROSYN) 500 MG tablet   Oral   Take 1 tablet (500 mg total) by mouth 2 (two) times daily.   30 tablet   0   . ondansetron (ZOFRAN ODT) 4 MG disintegrating tablet   Oral   Take 1 tablet (4 mg total) by mouth every 8 (eight) hours as needed for nausea.   10 tablet   0   . oxyCODONE-acetaminophen (PERCOCET/ROXICET) 5-325 MG per tablet   Oral   Take 2 tablets by mouth every 4 (four) hours as needed for pain.   6 tablet   0    BP 124/78  Pulse 93  Temp(Src) 99.3 F (37.4 C) (Oral)  Resp 16  Ht 5\' 11"  (1.803 m)  Wt 279 lb (126.554 kg)  BMI 38.93 kg/m2  SpO2 98% Physical Exam  Nursing note and vitals reviewed. Constitutional: He appears well-developed and well-nourished.  Pt lying in exam bed on left side. NAD. Emesis bag at his side.  HENT:  Head: Normocephalic and atraumatic.  Mouth/Throat: Uvula  is midline, oropharynx is clear and moist and mucous membranes are normal.  Eyes: Conjunctivae are normal. No scleral icterus.  Neck: Normal range of motion.  Cardiovascular: Normal rate, regular rhythm and normal heart sounds.   Pulmonary/Chest: Effort normal and breath sounds normal. No respiratory distress. He has no wheezes. He has no rales. He exhibits no tenderness.  Abdominal: Soft. Bowel sounds are normal. He exhibits no distension and no mass. There is tenderness ( mild left sided tenderness). There is no rebound and no guarding.  Soft, non-distended, mild left sided tenderness. No rebound, guarding or mass.  Musculoskeletal: Normal range of motion.  Neurological: He is alert.  Skin:  Skin is warm and dry.    ED Course  Procedures (including critical care time) Labs Review Labs Reviewed  CBC WITH DIFFERENTIAL - Abnormal; Notable for the following:    WBC 17.2 (*)    RBC 4.17 (*)    Hemoglobin 12.3 (*)    HCT 35.5 (*)    Monocytes Relative 14 (*)    Neutro Abs 11.7 (*)    Monocytes Absolute 2.4 (*)    All other components within normal limits  COMPREHENSIVE METABOLIC PANEL - Abnormal; Notable for the following:    Sodium 129 (*)    Potassium 3.4 (*)    Chloride 94 (*)    Glucose, Bld 129 (*)    Albumin 3.0 (*)    All other components within normal limits  PATHOLOGIST SMEAR REVIEW  URINALYSIS, ROUTINE W REFLEX MICROSCOPIC   Imaging Review Dg Abd Acute W/chest  08/01/2013   ADDENDUM REPORT: 08/01/2013 16:06  ADDENDUM: I inadvertently stated that the round calcification in the midline of the pelvis might be a calcified uterine fibroid. That is, of course, not possible in this male patient, and is therefore most likely a phlebolith or a calcified epiploic appendage adjacent to the sigmoid colon. This was discussed by telephone with Dr. Renae Gloss of the emergency department.   Electronically Signed   By: Hulan Saas M.D.   On: 08/01/2013 16:06   08/01/2013   CLINICAL DATA:  Initial encounter for low abdominal pain, nausea/vomiting, and diarrhea. Current history of irritable bowel syndrome.  EXAM: ACUTE ABDOMEN SERIES (ABDOMEN 2 VIEW & CHEST 1 VIEW)  COMPARISON:  None.  FINDINGS: Bowel gas pattern unremarkable without evidence of obstruction or significant ileus. No evidence of free air or significant air-fluid levels on the erect image. Air-fluid levels in the colon consistent liquid stool in this patient with given history of diarrhea. Round calcification in the midline of the pelvis may be a large phlebolith or a degenerated uterine fibroid. No abnormal calcifications elsewhere, specifically, no visible opaque urinary tract calculi. Regional skeleton intact.   Cardiomediastinal silhouette unremarkable. Lungs clear. Bronchovascular markings normal. Pulmonary vascularity normal. No visible pleural effusions. No pneumothorax. Mild elevation of the right hemidiaphragm.  IMPRESSION: No acute abdominal or pulmonary abnormality.  Electronically Signed: By: Hulan Saas M.D. On: 08/01/2013 15:57    EKG Interpretation   None       MDM  No diagnosis found. Pt with hx of IBS and GERD reports persistent vomiting and diarrhea since Saturday, 11/1.  On exam abdomen is soft, non-distended, mild left sided tenderness. No rebound, guarding, or masses.   CBC: elevated WBC-17.2 CMP: Na-129  Acute Abd: no acute abdominal or pulmonary abnormality.   Discussed pt with Dr. Bernette Mayers, will continue to give IV fluids and tx nausea.  If able to keep down few ounces of PO  fluids, will discharge home.   4:21 PM: Pt still c/o feeling nauseous, has not attempted fluid challenge yet.  Pt signed out to [employer], PA-C at shift change.  Plan is still to discharge pt home after fluid challenge with GI f/u.  If pt still unable to tolerate PO fluids, consider abdominal CT.       Junius Finner, PA-C 08/01/13 1623

## 2013-08-02 NOTE — ED Provider Notes (Signed)
Medical screening examination/treatment/procedure(s) were performed by non-physician practitioner and as supervising physician I was immediately available for consultation/collaboration.   Dailan Pfalzgraf, MD 08/02/13 0052 

## 2013-08-02 NOTE — ED Provider Notes (Signed)
Medical screening examination/treatment/procedure(s) were performed by non-physician practitioner and as supervising physician I was immediately available for consultation/collaboration.  EKG Interpretation   None         Charles B. Sheldon, MD 08/02/13 0703 

## 2013-08-03 ENCOUNTER — Ambulatory Visit (HOSPITAL_COMMUNITY): Payer: Self-pay | Admitting: Psychiatry

## 2013-08-03 ENCOUNTER — Encounter: Payer: Self-pay | Admitting: Gastroenterology

## 2013-08-08 ENCOUNTER — Telehealth: Payer: Self-pay | Admitting: Pulmonary Disease

## 2013-08-08 ENCOUNTER — Ambulatory Visit (INDEPENDENT_AMBULATORY_CARE_PROVIDER_SITE_OTHER): Payer: No Typology Code available for payment source | Admitting: Gastroenterology

## 2013-08-08 ENCOUNTER — Encounter: Payer: Self-pay | Admitting: Gastroenterology

## 2013-08-08 VITALS — BP 120/60 | HR 80 | Ht 71.0 in | Wt 255.0 lb

## 2013-08-08 DIAGNOSIS — R112 Nausea with vomiting, unspecified: Secondary | ICD-10-CM

## 2013-08-08 DIAGNOSIS — K648 Other hemorrhoids: Secondary | ICD-10-CM

## 2013-08-08 NOTE — Progress Notes (Signed)
History of Present Illness: 37 year old white male with history of schizoaffective disorder, IBS and sleep apnea referred for evaluation of chronic nausea, vomiting and rectal pain.  Since his early 67s he's had episodic nausea with vomiting.  Symptoms clearly are worsening.  He now has chronic nausea and may vomit almost daily or every other day.  He claims he has lost 50 pounds over the past 6 months.  He is nauseated throughout the day although it may be worsened postprandially.  He denies abdominal pain, per se.  He has burning chest discomfort that is relieved with Pepcid.  He denies dysphagia.  He denies substance abuse or use.  Bowels are erratic characterized by alternating loose stools and constipation.  He's had several ER visits for painful hemorrhoids.    Past Medical History  Diagnosis Date  . GERD (gastroesophageal reflux disease)   . Schizoaffective disorder 07/01/2013  . Irritable bowel syndrome (IBS)   . Morbid obesity 07/01/2013  . Obstructive sleep apnea 07/01/2013   Past Surgical History  Procedure Laterality Date  . Appendectomy  1990   family history includes Alcohol abuse in his father; Breast cancer in his mother; Heart disease in his paternal grandfather. Current Outpatient Prescriptions  Medication Sig Dispense Refill  . clonazePAM (KLONOPIN) 0.5 MG tablet Takes one tablet by mouth four times a day      . famotidine (PEPCID) 40 MG tablet Take 0.5 tablets (20 mg total) by mouth 2 (two) times daily as needed for heartburn. OTC  10 tablet  0  . hydrocortisone (PROCTOZONE-HC) 2.5 % rectal cream Place rectally 2 (two) times daily.  30 g  0   No current facility-administered medications for this visit.   Allergies as of 08/08/2013  . (No Known Allergies)    reports that he quit smoking about 17 years ago. His smoking use included Cigarettes. He has a .075 pack-year smoking history. He has never used smokeless tobacco. He reports that he drinks about 0.5 ounces of alcohol  per week. He reports that he uses illicit drugs (Marijuana).     Review of Systems: Pertinent positive and negative review of systems were noted in the above HPI section. All other review of systems were otherwise negative.  Vital signs were reviewed in today's medical record Physical Exam: General: Well developed , well nourished, no acute distress Skin: anicteric Head: Normocephalic and atraumatic Eyes:  sclerae anicteric, EOMI Ears: Normal auditory acuity Mouth: No deformity or lesions Neck: Supple, no masses or thyromegaly Lungs: Clear throughout to auscultation Heart: Regular rate and rhythm; no murmurs, rubs or bruits Abdomen: Soft, non tender and non distended. No masses, hepatosplenomegaly or hernias noted. Normal Bowel sounds.  There is no succussion splash Rectal: An external hemorrhoid is present.  There are also several areas of superficial ulceration.  Perianal area is slightly reddened Musculoskeletal: Symmetrical with no gross deformities  Skin: No lesions on visible extremities Pulses:  Normal pulses noted Extremities: No clubbing, cyanosis, edema or deformities noted Neurological: Alert oriented x 4, grossly nonfocal Cervical Nodes:  No significant cervical adenopathy Inguinal Nodes: No significant inguinal adenopathy Psychological:  Alert and cooperative. Normal mood and affect

## 2013-08-08 NOTE — Telephone Encounter (Signed)
Order re-faxed to [facility]. Still looking for pt's sleep study. Rhonda J Cobb

## 2013-08-08 NOTE — Assessment & Plan Note (Addendum)
Chronic nausea with with intermittent vomiting and recent weight loss.  Symptoms could be do to partial gastric outlet obstruction, gastroparesis, much less likely active peptic disease.  He is on no medications associated with nausea.  Symptoms could be  functional as well.  Recommendations #1 upper endoscopy; if negative I will obtain a gastric emptying scan # 2 continue Pepcid #3 to consider CT of the head pending results of above

## 2013-08-08 NOTE — Assessment & Plan Note (Signed)
Patient clearly has hemorrhoidal disease.  There appears to be a significant amount of perianal inflammation with ulceration raising the question of underlying inflammatory bowel disease.  Recommendations #1 sigmoidoscopy

## 2013-08-08 NOTE — Patient Instructions (Signed)
You have been scheduled for an endoscopy and flexible sigmoidoscopy with propofol. Please follow the written instructions given to you at your visit today. If you use inhalers (even only as needed), please bring them with you on the day of your procedure. Your physician has requested that you go to www.startemmi.com and enter the access code given to you at your visit today. This web site gives a general overview about your procedure. However, you should still follow specific instructions given to you by our office regarding your preparation for the procedure.

## 2013-08-08 NOTE — Telephone Encounter (Signed)
LMTCB Sally E Ottinger ° °

## 2013-08-10 ENCOUNTER — Ambulatory Visit (INDEPENDENT_AMBULATORY_CARE_PROVIDER_SITE_OTHER): Payer: No Typology Code available for payment source | Admitting: General Surgery

## 2013-08-10 NOTE — Telephone Encounter (Signed)
Been waiting on Sleep Study. Called and left message with patient to contact me at [phone] as to where he had study so I can see if I can locate. Rhonda J Cobb

## 2013-08-11 NOTE — Telephone Encounter (Signed)
Called and lmoam for pt to contact me and let me know if he was successful in reaching someone with [facility] yesterday. If not, he has the option of coming over here and signing a medical release for [place] to try to obtain the study. Waiting on patient to return my call.  I have called and spoke with [facility] and let them know what is going on with the sleep study. Rhonda J Cobb

## 2013-08-11 NOTE — Telephone Encounter (Signed)
James from [employer] is calling requesting pt's last face to face prior to sleep study as well as a copy of the sleep study.  Pt's insurance has a very large deductible, so working w/ pt for a payment plan.  Fayrene Fearing can be reached at [phone] & requests to have face to face sleep study faxed to [phone].  Antionette Fairy

## 2013-08-11 NOTE — Telephone Encounter (Signed)
I spoke with patient yesterday and advised him again, that we do not have the sleep study and we do not have a signed medical release from him. Pt stated that he had the study with [facility] and that he had called [facility] and left a detailed message on an answering machine of him needing the sleep study faxed to our office. Advised the patient that we have not received this study as of yet. Advised the patient to call back over to [facility] and ask the person who answers the phone that he had already left one message with medical records and that he needed this study faxed over here to [phone] Attn: Bjorn Loser. Pt states that he really needs to have his cpap issues addressed and I explained to patient that we want his issues addressed as well, however, the DME needs the sleep study before they will be able to provide anything for him. Patient was suppose to contacted [facility] again yesterday, however, I have not received the sleep study yet. Rhonda J Cobb

## 2013-08-15 ENCOUNTER — Other Ambulatory Visit: Payer: Self-pay | Admitting: Family Medicine

## 2013-08-15 DIAGNOSIS — D649 Anemia, unspecified: Secondary | ICD-10-CM

## 2013-08-15 DIAGNOSIS — R739 Hyperglycemia, unspecified: Secondary | ICD-10-CM

## 2013-08-15 DIAGNOSIS — E871 Hypo-osmolality and hyponatremia: Secondary | ICD-10-CM

## 2013-08-15 DIAGNOSIS — D72829 Elevated white blood cell count, unspecified: Secondary | ICD-10-CM

## 2013-08-16 ENCOUNTER — Encounter: Payer: Self-pay | Admitting: Gastroenterology

## 2013-08-16 ENCOUNTER — Other Ambulatory Visit: Payer: Self-pay

## 2013-08-16 ENCOUNTER — Ambulatory Visit (AMBULATORY_SURGERY_CENTER): Payer: No Typology Code available for payment source | Admitting: Gastroenterology

## 2013-08-16 VITALS — BP 119/79 | HR 84 | Temp 99.0°F | Resp 16 | Ht 71.0 in | Wt 255.0 lb

## 2013-08-16 DIAGNOSIS — K5010 Crohn's disease of large intestine without complications: Secondary | ICD-10-CM

## 2013-08-16 DIAGNOSIS — R112 Nausea with vomiting, unspecified: Secondary | ICD-10-CM

## 2013-08-16 DIAGNOSIS — K5090 Crohn's disease, unspecified, without complications: Secondary | ICD-10-CM

## 2013-08-16 DIAGNOSIS — K209 Esophagitis, unspecified without bleeding: Secondary | ICD-10-CM

## 2013-08-16 DIAGNOSIS — K648 Other hemorrhoids: Secondary | ICD-10-CM

## 2013-08-16 DIAGNOSIS — K2990 Gastroduodenitis, unspecified, without bleeding: Secondary | ICD-10-CM

## 2013-08-16 DIAGNOSIS — K2970 Gastritis, unspecified, without bleeding: Secondary | ICD-10-CM

## 2013-08-16 DIAGNOSIS — K6289 Other specified diseases of anus and rectum: Secondary | ICD-10-CM

## 2013-08-16 MED ORDER — SODIUM CHLORIDE 0.9 % IV SOLN: 500.0000 mL | INTRAVENOUS | Status: DC

## 2013-08-16 MED ORDER — ESOMEPRAZOLE MAGNESIUM 40 MG PO CPDR: 40.0000 mg | DELAYED_RELEASE_CAPSULE | Freq: Every day | ORAL | Status: DC

## 2013-08-16 NOTE — Patient Instructions (Signed)
YOU HAD AN ENDOSCOPIC PROCEDURE TODAY AT THE [facility]: Refer to the procedure report that was given to you for any specific questions about what was found during the examination.  If the procedure report does not answer your questions, please call your gastroenterologist to clarify.  If you requested that your care partner not be given the details of your procedure findings, then the procedure report has been included in a sealed envelope for you to review at your convenience later.  YOU SHOULD EXPECT: Some feelings of bloating in the abdomen. Passage of more gas than usual.  Walking can help get rid of the air that was put into your GI tract during the procedure and reduce the bloating. If you had a lower endoscopy (such as a colonoscopy or flexible sigmoidoscopy) you may notice spotting of blood in your stool or on the toilet paper. If you underwent a bowel prep for your procedure, then you may not have a normal bowel movement for a few days.  DIET: Your first meal following the procedure should be a light meal and then it is ok to progress to your normal diet.  A half-sandwich or bowl of soup is an example of a good first meal.  Heavy or fried foods are harder to digest and may make you feel nauseous or bloated.  Likewise meals heavy in dairy and vegetables can cause extra gas to form and this can also increase the bloating.  Drink plenty of fluids but you should avoid alcoholic beverages for 24 hours.  ACTIVITY: Your care partner should take you home directly after the procedure.  You should plan to take it easy, moving slowly for the rest of the day.  You can resume normal activity the day after the procedure however you should NOT DRIVE or use heavy machinery for 24 hours (because of the sedation medicines used during the test).    SYMPTOMS TO REPORT IMMEDIATELY: A gastroenterologist can be reached at any hour.  During normal business hours, 8:30 AM to 5:00 PM Monday through Friday,  call [phone].  After hours and on weekends, please call the GI answering service at [phone] who will take a message and have the physician on call contact you.   Following lower endoscopy (colonoscopy or flexible sigmoidoscopy):  Excessive amounts of blood in the stool  Significant tenderness or worsening of abdominal pains  Swelling of the abdomen that is new, acute  Fever of 100F or higher  Following upper endoscopy (EGD)  Vomiting of blood or coffee ground material  New chest pain or pain under the shoulder blades  Painful or persistently difficult swallowing  New shortness of breath  Fever of 100F or higher  Black, tarry-looking stools  FOLLOW UP: If any biopsies were taken you will be contacted by phone or by letter within the next 1-3 weeks.  Call your gastroenterologist if you have not heard about the biopsies in 3 weeks.  Our staff will call the home number listed on your records the next business day following your procedure to check on you and address any questions or concerns that you may have at that time regarding the information given to you following your procedure. This is a courtesy call and so if there is no answer at the home number and we have not heard from you through the emergency physician on call, we will assume that you have returned to your regular daily activities without incident.  SIGNATURES/CONFIDENTIALITY: You and/or your care   partner have signed paperwork which will be entered into your electronic medical record.  These signatures attest to the fact that that the information above on your After Visit Summary has been reviewed and is understood.  Full responsibility of the confidentiality of this discharge information lies with you and/or your care-partner.  Recommendations See procedure report 

## 2013-08-16 NOTE — Progress Notes (Signed)
Report to pacu rn, vss, bbs=clear 

## 2013-08-16 NOTE — Op Note (Addendum)
[facility] [address] [place], [address]   FLEXIBLE SIGMOIDOSCOPY PROCEDURE REPORT  PATIENT: Nicholas Bradford, Nicholas Bradford  MR#: [id number] BIRTHDATE: 1976/02/06 , 37  yrs. old GENDER: Male ENDOSCOPIST: Louis Meckel, MD REFERRED BY: Juleen China, M.D. PROCEDURE DATE:  08/16/2013 PROCEDURE:   Sigmoidoscopy, diagnostic ASA CLASS:   Class II INDICATIONS:rectal pain. MEDICATIONS: MAC sedation, administered by CRNA and propofol (Diprivan) 200mg  IV  DESCRIPTION OF PROCEDURE:   After the risks benefits and alternatives of the procedure were thoroughly explained, informed consent was obtained.  revealed external hemorrhoids.  There was a deep linear ulcer down to the muscularis beginning at the anus and extending into the buttock The LB [id number]  endoscope was introduced through the anus  and advanced to the descending colon , limited by No adverse events experienced.   The quality of the prep was    .  The instrument was then slowly withdrawn as the mucosa was fully examined.       1.  COLON FINDINGS: In the sigmoid and descending colon there was extensive diverticular disease characterized by multiple diverticula, muscular hypertrophy and luminal narrowing. 2.  In the sigmoid and descending colon there was extensive diverticular disease characterized by multiple diverticula, muscular hypertrophy and luminal narrowing. 3.  The colon mucosa was otherwise normal. Retroflexed views revealed internal hemorrhoid.    The scope was then withdrawn from the patient and the procedure terminated.  COMPLICATIONS: There were no complications.  ENDOSCOPIC IMPRESSION: 1.  diverticulosis 2.  internal and external hemorrhoids 3.  deep perianal ulceration  RECOMMENDATIONS: 1.  Consult general surgery for further evaluation 2.  CT of the abdomen and pelvis-rule out Crohn's disease   REPEAT EXAM:   _______________________________ eSignedLouis Meckel, MD  08/16/2013 3:16 PM Revised: 08/16/2013 3:16 PM  CC: Dr. Karie Soda  PATIENT NAME:  Nicholas Bradford, Nicholas Bradford MR#: [id number]

## 2013-08-16 NOTE — Progress Notes (Signed)
Patient did not have preoperative order for IV antibiotic SSI prophylaxis. (G8918)  Patient did not experience any of the following events: a burn prior to discharge; a fall within the facility; wrong site/side/patient/procedure/implant event; or a hospital transfer or hospital admission upon discharge from the facility. (G8907)  

## 2013-08-16 NOTE — Op Note (Addendum)
[facility] [address] [place], [address]   ENDOSCOPY PROCEDURE REPORT  PATIENT: Nicholas Bradford, Nicholas Bradford  MR#: [id number] BIRTHDATE: 07-13-1976 , 37  yrs. old GENDER: Male ENDOSCOPIST: Louis Meckel, MD REFERRED BY:  Juleen China, M.D. PROCEDURE DATE:  08/16/2013 PROCEDURE:  EGD w/ biopsy ASA CLASS:     Class II INDICATIONS:  Vomiting.   Nausea. MEDICATIONS: There was residual sedation effect present from prior procedure, MAC sedation, administered by CRNA, and propofol (Diprivan) 100mg  IV TOPICAL ANESTHETIC:  DESCRIPTION OF PROCEDURE: After the risks benefits and alternatives of the procedure were thoroughly explained, informed consent was obtained.  The    endoscope was introduced through the mouth and advanced to the third portion of the duodenum. Without limitations. The instrument was slowly withdrawn as the mucosa was fully examined.      In the gastric body and antrum there is moderate diffuse erythema. Biopsies were taken. There are multiple erosions at the GE junction and there is erythematous mucosa.   The remainder of the upper endoscopy exam was otherwise normal.  Retroflexed views revealed no abnormalities. The scope was then withdrawn from the patient and the procedure completed.  COMPLICATIONS: There were no complications. ENDOSCOPIC IMPRESSION: 1.  erosive esophagitis 2.  gastritis  RECOMMENDATIONS: 1.  Await biopsy results 2.  My office will arrange for you to have a CT scan of your abdomen and pelvis. 3.  begin nexium  REPEAT EXAM:  eSigned:  Louis Meckel, MD 08/16/2013 3:15 PM Revised: 08/16/2013 3:15 PM  CC: Dr. Karie Soda  PATIENT NAME:  Nicholas Bradford, Nicholas Bradford MR#: [id number]

## 2013-08-16 NOTE — Progress Notes (Signed)
Called to room to assist during endoscopic procedure.  Patient ID and intended procedure confirmed with present staff. Received instructions for my participation in the procedure from the performing physician.  

## 2013-08-16 NOTE — Telephone Encounter (Signed)
Spoke to pt ghe is aware of his appt 08/19/13 Tobe Sos

## 2013-08-17 ENCOUNTER — Other Ambulatory Visit (INDEPENDENT_AMBULATORY_CARE_PROVIDER_SITE_OTHER): Payer: No Typology Code available for payment source

## 2013-08-17 ENCOUNTER — Telehealth: Payer: Self-pay

## 2013-08-17 DIAGNOSIS — R7309 Other abnormal glucose: Secondary | ICD-10-CM

## 2013-08-17 DIAGNOSIS — D649 Anemia, unspecified: Secondary | ICD-10-CM

## 2013-08-17 DIAGNOSIS — R739 Hyperglycemia, unspecified: Secondary | ICD-10-CM

## 2013-08-17 DIAGNOSIS — E871 Hypo-osmolality and hyponatremia: Secondary | ICD-10-CM

## 2013-08-17 DIAGNOSIS — D72829 Elevated white blood cell count, unspecified: Secondary | ICD-10-CM

## 2013-08-17 LAB — COMPREHENSIVE METABOLIC PANEL
AST: 25 U/L (ref 0–37)
BUN: 7 mg/dL (ref 6–23)
Calcium: 8.7 mg/dL (ref 8.4–10.5)
Chloride: 94 mEq/L — ABNORMAL LOW (ref 96–112)
Creatinine, Ser: 0.9 mg/dL (ref 0.4–1.5)
GFR: 103.82 mL/min (ref >60.00)
Total Protein: 7.5 g/dL (ref 6.0–8.3)

## 2013-08-17 LAB — CBC WITH DIFFERENTIAL/PLATELET
Basophils Relative: 0.6 % (ref 0.0–3.0)
Eosinophils Relative: 0.6 % (ref 0.0–5.0)
Hemoglobin: 11.1 g/dL — ABNORMAL LOW (ref 13.0–17.0)
Lymphocytes Relative: 23 % (ref 12.0–46.0)
Monocytes Relative: 6.9 % (ref 3.0–12.0)
Neutro Abs: 10.3 10*3/uL — ABNORMAL HIGH (ref 1.4–7.7)
RBC: 3.94 Mil/uL — ABNORMAL LOW (ref 4.22–5.81)
WBC: 14.9 10*3/uL — ABNORMAL HIGH (ref 4.5–10.5)

## 2013-08-17 NOTE — Telephone Encounter (Signed)
Pt scheduled for CT of abdomen and pelvis at [place] [facility] 08/19/13. Pt to be NPO after midnight, drink 1st bottle of contrast at 7am, 2nd bottle at 8am. Pt aware of appt date and time.

## 2013-08-17 NOTE — Telephone Encounter (Signed)
Left a message @ [phone] for the pt to call if any questions or concerns. maw

## 2013-08-18 ENCOUNTER — Telehealth: Payer: Self-pay | Admitting: *Deleted

## 2013-08-18 ENCOUNTER — Ambulatory Visit (INDEPENDENT_AMBULATORY_CARE_PROVIDER_SITE_OTHER): Payer: No Typology Code available for payment source | Admitting: General Surgery

## 2013-08-18 MED ORDER — OMEPRAZOLE 40 MG PO CPDR: 40.0000 mg | DELAYED_RELEASE_CAPSULE | Freq: Every day | ORAL | Status: DC

## 2013-08-18 NOTE — Telephone Encounter (Signed)
L/m for pt that I sent in omeprazole to his pharmacy

## 2013-08-19 ENCOUNTER — Inpatient Hospital Stay (HOSPITAL_COMMUNITY)
Admission: AD | Admit: 2013-08-19 | Discharge: 2013-08-24 | DRG: 442 | Disposition: A | Discharge status: Home Health | Payer: No Typology Code available for payment source | Source: Ambulatory Visit | Attending: Gastroenterology | Admitting: Gastroenterology

## 2013-08-19 ENCOUNTER — Encounter (INDEPENDENT_AMBULATORY_CARE_PROVIDER_SITE_OTHER): Payer: Self-pay | Admitting: General Surgery

## 2013-08-19 ENCOUNTER — Ambulatory Visit (INDEPENDENT_AMBULATORY_CARE_PROVIDER_SITE_OTHER)
Admission: RE | Admit: 2013-08-19 | Discharge: 2013-08-19 | Disposition: A | Discharge status: Home / Self Care | Payer: No Typology Code available for payment source | Source: Ambulatory Visit | Attending: Gastroenterology | Admitting: Gastroenterology

## 2013-08-19 ENCOUNTER — Encounter (HOSPITAL_COMMUNITY): Payer: Self-pay

## 2013-08-19 DIAGNOSIS — R634 Abnormal weight loss: Secondary | ICD-10-CM | POA: Diagnosis present

## 2013-08-19 DIAGNOSIS — K750 Abscess of liver: Principal | ICD-10-CM | POA: Diagnosis present

## 2013-08-19 DIAGNOSIS — K644 Residual hemorrhoidal skin tags: Secondary | ICD-10-CM | POA: Diagnosis present

## 2013-08-19 DIAGNOSIS — Z79899 Other long term (current) drug therapy: Secondary | ICD-10-CM

## 2013-08-19 DIAGNOSIS — L909 Atrophic disorder of skin, unspecified: Secondary | ICD-10-CM | POA: Diagnosis present

## 2013-08-19 DIAGNOSIS — K602 Anal fissure, unspecified: Secondary | ICD-10-CM | POA: Diagnosis present

## 2013-08-19 DIAGNOSIS — K5090 Crohn's disease, unspecified, without complications: Secondary | ICD-10-CM

## 2013-08-19 DIAGNOSIS — K769 Liver disease, unspecified: Secondary | ICD-10-CM

## 2013-08-19 DIAGNOSIS — K219 Gastro-esophageal reflux disease without esophagitis: Secondary | ICD-10-CM | POA: Diagnosis present

## 2013-08-19 DIAGNOSIS — Z6833 Body mass index (BMI) 33.0-33.9, adult: Secondary | ICD-10-CM

## 2013-08-19 DIAGNOSIS — K2970 Gastritis, unspecified, without bleeding: Secondary | ICD-10-CM | POA: Diagnosis present

## 2013-08-19 DIAGNOSIS — D72829 Elevated white blood cell count, unspecified: Secondary | ICD-10-CM | POA: Diagnosis present

## 2013-08-19 DIAGNOSIS — K5730 Diverticulosis of large intestine without perforation or abscess without bleeding: Secondary | ICD-10-CM | POA: Diagnosis present

## 2013-08-19 DIAGNOSIS — Z87891 Personal history of nicotine dependence: Secondary | ICD-10-CM

## 2013-08-19 DIAGNOSIS — R112 Nausea with vomiting, unspecified: Secondary | ICD-10-CM | POA: Diagnosis present

## 2013-08-19 DIAGNOSIS — K626 Ulcer of anus and rectum: Secondary | ICD-10-CM | POA: Diagnosis present

## 2013-08-19 DIAGNOSIS — K209 Esophagitis, unspecified without bleeding: Secondary | ICD-10-CM | POA: Diagnosis present

## 2013-08-19 DIAGNOSIS — F319 Bipolar disorder, unspecified: Secondary | ICD-10-CM | POA: Diagnosis present

## 2013-08-19 DIAGNOSIS — K648 Other hemorrhoids: Secondary | ICD-10-CM | POA: Diagnosis present

## 2013-08-19 DIAGNOSIS — F259 Schizoaffective disorder, unspecified: Secondary | ICD-10-CM | POA: Diagnosis present

## 2013-08-19 DIAGNOSIS — R160 Hepatomegaly, not elsewhere classified: Secondary | ICD-10-CM | POA: Diagnosis present

## 2013-08-19 DIAGNOSIS — K589 Irritable bowel syndrome without diarrhea: Secondary | ICD-10-CM | POA: Diagnosis present

## 2013-08-19 DIAGNOSIS — R61 Generalized hyperhidrosis: Secondary | ICD-10-CM

## 2013-08-19 DIAGNOSIS — Z113 Encounter for screening for infections with a predominantly sexual mode of transmission: Secondary | ICD-10-CM

## 2013-08-19 DIAGNOSIS — G4733 Obstructive sleep apnea (adult) (pediatric): Secondary | ICD-10-CM | POA: Diagnosis present

## 2013-08-19 LAB — CBC WITH DIFFERENTIAL/PLATELET
Basophils Absolute: 0 10*3/uL (ref 0.0–0.1)
Basophils Relative: 0 % (ref 0–1)
Eosinophils Absolute: 0.1 10*3/uL (ref 0.0–0.7)
Eosinophils Relative: 1 % (ref 0–5)
HCT: 28.2 % — ABNORMAL LOW (ref 39.0–52.0)
Hemoglobin: 9.2 g/dL — ABNORMAL LOW (ref 13.0–17.0)
Lymphocytes Relative: 28 % (ref 12–46)
Lymphs Abs: 3.6 10*3/uL (ref 0.7–4.0)
MCH: 27.9 pg (ref 26.0–34.0)
MCHC: 32.6 g/dL (ref 30.0–36.0)
MCV: 85.5 fL (ref 78.0–100.0)
Monocytes Absolute: 1.2 10*3/uL — ABNORMAL HIGH (ref 0.1–1.0)
Monocytes Relative: 9 % (ref 3–12)
Neutro Abs: 8.1 10*3/uL — ABNORMAL HIGH (ref 1.7–7.7)
Neutrophils Relative %: 62 % (ref 43–77)
Platelets: 320 10*3/uL (ref 150–400)
RBC: 3.3 MIL/uL — ABNORMAL LOW (ref 4.22–5.81)
RDW: 14.6 % (ref 11.5–15.5)
WBC: 13 10*3/uL — ABNORMAL HIGH (ref 4.0–10.5)

## 2013-08-19 LAB — COMPREHENSIVE METABOLIC PANEL
ALT: 14 U/L (ref 0–53)
AST: 22 U/L (ref 0–37)
Albumin: 2.2 g/dL — ABNORMAL LOW (ref 3.5–5.2)
Alkaline Phosphatase: 83 U/L (ref 39–117)
CO2: 24 mEq/L (ref 19–32)
Chloride: 95 mEq/L — ABNORMAL LOW (ref 96–112)
Potassium: 4.3 mEq/L (ref 3.5–5.1)
Sodium: 130 mEq/L — ABNORMAL LOW (ref 135–145)
Total Bilirubin: 0.5 mg/dL (ref 0.3–1.2)
Total Protein: 6.9 g/dL (ref 6.0–8.3)

## 2013-08-19 MED ORDER — PSYLLIUM 95 % PO PACK
1.0000 | PACK | Freq: Every day | ORAL | Status: DC | PRN
  Filled 2013-08-19: qty 1

## 2013-08-19 MED ORDER — DOCUSATE SODIUM 100 MG PO CAPS
100.0000 mg | ORAL_CAPSULE | Freq: Two times a day (BID) | ORAL | Status: DC | PRN
  Administered 2013-08-22: 100 mg via ORAL
  Filled 2013-08-19: qty 1

## 2013-08-19 MED ORDER — PROMETHAZINE HCL 25 MG/ML IJ SOLN
12.5000 mg | Freq: Four times a day (QID) | INTRAMUSCULAR | Status: DC | PRN
Start: 2013-08-19 — End: 2013-08-24

## 2013-08-19 MED ORDER — CLONAZEPAM 0.5 MG PO TABS
0.5000 mg | ORAL_TABLET | Freq: Four times a day (QID) | ORAL | Status: DC
  Administered 2013-08-19 – 2013-08-24 (×19): 0.5 mg via ORAL
  Filled 2013-08-19 (×19): qty 1

## 2013-08-19 MED ORDER — PIPERACILLIN-TAZOBACTAM 3.375 G IVPB
3.3750 g | Freq: Three times a day (TID) | INTRAVENOUS | Status: DC
  Administered 2013-08-20 – 2013-08-23 (×10): 3.375 g via INTRAVENOUS
  Filled 2013-08-19 (×12): qty 50

## 2013-08-19 MED ORDER — INFLUENZA VAC SPLIT QUAD 0.5 ML IM SUSP
0.5000 mL | INTRAMUSCULAR | Status: DC
  Filled 2013-08-19 (×2): qty 0.5

## 2013-08-19 MED ORDER — PIPERACILLIN-TAZOBACTAM 3.375 G IVPB: 3.3750 g | Freq: Four times a day (QID) | INTRAVENOUS | Status: DC

## 2013-08-19 MED ORDER — HYDROCODONE-ACETAMINOPHEN 5-325 MG PO TABS
1.0000 | ORAL_TABLET | Freq: Four times a day (QID) | ORAL | Status: DC | PRN
  Administered 2013-08-20 – 2013-08-24 (×13): 1 via ORAL
  Filled 2013-08-19 (×13): qty 1

## 2013-08-19 MED ORDER — PIPERACILLIN-TAZOBACTAM 3.375 G IVPB 30 MIN: 3.3750 g | Freq: Three times a day (TID) | INTRAVENOUS | Status: DC

## 2013-08-19 MED ORDER — IOHEXOL 300 MG/ML  SOLN
100.0000 mL | Freq: Once | INTRAMUSCULAR | Status: AC | PRN
  Administered 2013-08-19: 100 mL via INTRAVENOUS

## 2013-08-19 MED ORDER — PSYLLIUM 95 % PO PACK: 1.0000 | PACK | Freq: Every day | ORAL | Status: DC

## 2013-08-19 MED ORDER — DEXTROSE-NACL 5-0.9 % IV SOLN
INTRAVENOUS | Status: DC
  Administered 2013-08-19: 18:00:00 via INTRAVENOUS

## 2013-08-19 MED ORDER — GERHARDT'S BUTT CREAM
TOPICAL_CREAM | Freq: Three times a day (TID) | CUTANEOUS | Status: DC
  Administered 2013-08-19: 18:00:00 via TOPICAL
  Administered 2013-08-19: 1 via TOPICAL
  Administered 2013-08-20 – 2013-08-22 (×6): via TOPICAL
  Filled 2013-08-19: qty 1

## 2013-08-19 MED ORDER — ACETAMINOPHEN 325 MG PO TABS
650.0000 mg | ORAL_TABLET | ORAL | Status: DC | PRN
  Administered 2013-08-19: 650 mg via ORAL
  Filled 2013-08-19: qty 2

## 2013-08-19 MED ORDER — PIPERACILLIN-TAZOBACTAM 3.375 G IVPB
3.3750 g | Freq: Three times a day (TID) | INTRAVENOUS | Status: DC
  Filled 2013-08-19 (×2): qty 50

## 2013-08-19 MED ORDER — PANTOPRAZOLE SODIUM 40 MG PO TBEC
40.0000 mg | DELAYED_RELEASE_TABLET | Freq: Every day | ORAL | Status: DC
  Administered 2013-08-19 – 2013-08-24 (×6): 40 mg via ORAL
  Filled 2013-08-19 (×6): qty 1

## 2013-08-19 NOTE — Consult Note (Signed)
Patient was seen and examined. He has a tender mass that is palpable just below the right costal margin laterally. He also has ulcerations on the left and right perianal skin. He has a prominent external hemorrhoid on the right side as well. No classic anal fissure. We will start local therapy for the fissure. Agree with interventional radiology consultation to biopsy and or drain the liver mass/abscess.

## 2013-08-19 NOTE — H&P (Signed)
GI Consultation  Referring Provider: No ref. provider found Primary Care Physician:  Hannah Beat, MD Primary Gastroenterologist:  Dr. Arlyce Dice  Reason for Consultation:  ** History of Present Illness: 37 year old white male with history of schizoaffective disorder, IBS and sleep apnea referred for evaluation of chronic nausea, vomiting and rectal pain. Since his early 30s he's had episodic nausea with vomiting. Symptoms clearly are worsening. He now has chronic nausea and may vomit almost daily or every other day. He claims he has lost 50 pounds over the past 6 months. He is nauseated throughout the day although it may be worsened postprandially. He denies abdominal pain, per se. He has burning chest discomfort that is relieved with Pepcid. He denies dysphagia. He denies substance abuse or use. Bowels are erratic characterized by alternating loose stools and constipation. He's had several ER visits for painful hemorrhoids.  Exam is remarkable for a deep fissure emanating from the anus down to the level of the muscularis, and external skin tags and hemorrhoids.  The patient underwent upper endoscopy on November 18 that demonstrated mild gastritis.  Sigmoidoscopy was done because of a concern for possible perianal Crohn's disease.  Diverticulosis, internal and external hemorrhoids were seen.  CT scan was ordered to rule out Crohn's disease or partial bowel obstruction.  This demonstrated a multiloculated mass in the right lobe of the liver.  Patient is admitted for further evaluation and therapy.  Past Medical History   Diagnosis  Date   .  GERD (gastroesophageal reflux disease)    .  Schizoaffective disorder  07/01/2013   .  Irritable bowel syndrome (IBS)    .  Morbid obesity  07/01/2013   .  Obstructive sleep apnea  07/01/2013    Past Surgical History   Procedure  Laterality  Date   .  Appendectomy   1990   family history includes Alcohol abuse in his father; Breast cancer in his  mother; Heart disease in his paternal grandfather.  Current Outpatient Prescriptions   Medication  Sig  Dispense  Refill   .  clonazePAM (KLONOPIN) 0.5 MG tablet  Takes one tablet by mouth four times a day     .  famotidine (PEPCID) 40 MG tablet  Take 0.5 tablets (20 mg total) by mouth 2 (two) times daily as needed for heartburn. OTC  10 tablet  0   .  hydrocortisone (PROCTOZONE-HC) 2.5 % rectal cream  Place rectally 2 (two) times daily.  30 g  0    No current facility-administered medications for this visit.    Allergies as of 08/08/2013   .  (No Known Allergies)   reports that he quit smoking about 17 years ago. His smoking use included Cigarettes. He has a .075 pack-year smoking history. He has never used smokeless tobacco. He reports that he drinks about 0.5 ounces of alcohol per week. He reports that he uses illicit drugs (Marijuana).   Review of Systems: Pertinent positive and negative review of systems were noted in the above HPI section. All other review of systems were otherwise negative.  Vital signs were reviewed in today's medical record   Physical Exam:  General: Well developed , well nourished, no acute distress  Skin: anicteric  Head: Normocephalic and atraumatic  Eyes: sclerae anicteric, EOMI  Ears: Normal auditory acuity  Mouth: No deformity or lesions  Neck: Supple, no masses or thyromegaly  Lungs: Clear throughout to auscultation  Heart: Regular rate and rhythm;  no murmurs, rubs or bruits  Abdomen: Soft, non tender and non distended. No masses, hepatosplenomegaly or hernias noted. Normal Bowel sounds. There is no succussion splash  Rectal: An external hemorrhoid is present. There are also several areas of superficial ulceration. Perianal area is slightly reddened  Musculoskeletal: Symmetrical with no gross deformities  Skin: No lesions on visible extremities  Pulses: Normal pulses noted  Extremities: No clubbing, cyanosis, edema or deformities noted  Neurological:  Alert oriented x 4, grossly nonfocal  Cervical Nodes: No significant cervical adenopathy  Inguinal Nodes: No significant inguinal adenopathy  Psychological: Alert and cooperative. Normal mood and affect   Vital signs in last 24 hours: Temp:  [99.9 F (37.7 C)] 99.9 F (37.7 C) (11/21 1130) Pulse Rate:  [105] 105 (11/21 1130) Resp:  [18] 18 (11/21 1130) BP: (102)/(68) 102/68 mmHg (11/21 1130) SpO2:  [97 %] 97 % (11/21 1130)    Lab Results:  Recent Labs  08/17/13 1024  WBC 14.9*  HGB 11.1*  HCT 33.3*  PLT 323.0   BMET  Recent Labs  08/17/13 1024  NA 129*  K 4.3  CL 94*  CO2 27  GLUCOSE 111*  BUN 7  CREATININE 0.9  CALCIUM 8.7   LFT  Recent Labs  08/17/13 1024  PROT 7.5  ALBUMIN 2.4*  AST 25  ALT 22  ALKPHOS 82  BILITOT 0.9   PT/INR No results found for this basename: LABPROT, INR,  in the last 72 hours Hepatitis Panel No results found for this basename: HEPBSAG, HCVAB, HEPAIGM, HEPBIGM,  in the last 72 hours Additional Labs   Studies/Results: Ct Abdomen Pelvis W Contrast  08/19/2013   CLINICAL DATA:  Abdominal pain, nausea, vomiting  EXAM: CT ABDOMEN AND PELVIS WITH CONTRAST  TECHNIQUE: Multidetector CT imaging of the abdomen and pelvis was performed using the standard protocol following bolus administration of intravenous contrast.  CONTRAST:  OMNIPAQUE IOHEXOL 300 MG/ML  SOLN  COMPARISON:  08/01/2013 acute abdominal series  FINDINGS: Clear lung bases. Trace dependent right pleural effusion. Normal heart size. No hiatal hernia.  Abdomen: Throughout the posterior aspect of the right liver, there is a complex septated enhancing low-attenuation large area measuring 9.4 x 11.3 x 9 cm. This is most compatible with a complex hepatic abscess, less likely considerations include complex biloma, hematoma, or necrotic neoplastic process.  No biliary dilatation.  Hepatic and portal veins are patent.  Collapsed gallbladder, biliary system, pancreas, spleen,  adrenal glands, and kidneys are within normal limits for age and demonstrate no acute process.  Negative for bowel obstruction, dilatation, ileus, or free air. Left descending colon and sigmoid diverticulosis evident.  No abdominal free fluid, adenopathy, or hemorrhage.  Negative for aneurysm or acute vascular process.  Pelvis: Sigmoid diverticulosis again evident. Urinary bladder under distended. No pelvic fluid collection, hemorrhage, additional abscess, adenopathy, inguinal abnormality, or hernia.  No acute osseous finding  IMPRESSION: 11 cm posterior right hepatic complex enhancing low-attenuation hepatic abnormality, consistent with an hepatic abscess.  This would be amenable to image guided aspiration and drainage.  Critical Value/emergent results were called by telephone at the time of interpretation on 08/19/2013 at 9:46 AM to Dr.Vinisha Faxon [facility] , who verbally acknowledged these results.   Electronically Signed   By: Ruel Favors M.D.   On: 08/19/2013 09:48    Impression #1 complex liver mass, probable abscess.  A source of abscess is not evident by CT scan. The patient's complaints of chronic nausea and vomiting and weight loss probably  are related to the mass. #2 perirectal disease.  I'm concerned about  perirectal Crohn's disease although there is no support for this by either sigmoidoscopy or CT scan #3 schizoaffective disorder  Recommendations #1 infectious disease consultation #2 consult general surgery for evaluation of rectal disease  Patient will likely require percutaneous drainage of the abscess but would like input from infectious disease and surgery prior to attempting this.  He will also require antibiotic therapy.       Barbette Hair. Arlyce Dice, MD, [facility] [phone]    08/19/2013, 12:07 PM

## 2013-08-19 NOTE — Progress Notes (Signed)
Agree 

## 2013-08-19 NOTE — Progress Notes (Signed)
Patient admitted to unit rm 1507. Alert and oriented and ambulated to floor accompanied by male family member and admission clerk. In no apparent distress at this moment. MD paged for orders. MD reports he will place orders now that patient is in. Vwilliams,rn.

## 2013-08-19 NOTE — Consult Note (Signed)
Reason for Consult: Liver abscess and rectal fissure Referring Physician: Dr. Melvia Heaps PCP: Hannah Beat, MD Psychiatry:  Dr. Barnett Abu  Nicholas Bradford is an 37 y.o. male.  HPI: This is a 37 y/o male who has had problems with almost daily nausea and vomiting for 2 years.  He initially just lived with it.  He has had ongoing symptoms and saw his PCP who referred him to Dr. Arlyce Dice.  He performed endoscopy showing Gastritis and Erosive esophagitis on 08/16/13.  Sigmoidoscopy same day  shows diverticulosis, hemorrhoids and a perianal ulcer. He was then sent for CT scan to rule out Crohn's disease.  This shows: Throughout the posterior aspect of the right liver, there is a complex septated enhancing low-attenuation large area measuring 9.4 x 11.3 x 9 cm. This is most compatible with a complex hepatic  Abscess.  He is currently febrile. He does had an abdominal US from April 2012 and liver was normal. He did have a small spleen.  We are ask to see now for evaluation of his anal fissure and liver abscess.     Past Medical History  Diagnosis Date  . GERD (gastroesophageal reflux disease) Currently On Pepcid.   . Schizoaffective disorder/most recent dx Bipolar depression by Dr. Evelene Croon. 07/01/2013  . Irritable bowel syndrome (IBS)   . Morbid obesity 07/01/2013  . Obstructive sleep apnea ( he was on CPAP but intolerant and has not had trial with other mask. 07/01/2013    Past Surgical History  Procedure Laterality Date  . Appendectomy  1990    Family History  Problem Relation Age of Onset  . Breast cancer Mother   . Alcohol abuse Father   . Heart disease Paternal Grandfather     Social History:  reports that he quit smoking about 17 years ago. His smoking use included Cigarettes. He has a .075 pack-year smoking history. He has never used smokeless tobacco. He reports that he drinks about 0.5 ounces of alcohol per week. He reports that he uses illicit drugs (Marijuana).  Allergies: No  Known Allergies  Medications:  Prior to Admission:  Prescriptions prior to admission  Medication Sig Dispense Refill   He has a prescription for Pristiq, but has not filled it because of N/V ISSUE.     . clonazePAM (KLONOPIN) 0.5 MG tablet Takes one tablet by mouth four times a day      . esomeprazole (NEXIUM) 20 MG capsule Take 20 mg by mouth daily at 12 noon.      . Multiple Vitamin (MULTIVITAMIN WITH MINERALS) TABS tablet Take 1 tablet by mouth daily.       Scheduled: . clonazePAM  0.5 mg Oral QID  . [START ON 08/20/2013] influenza vac split quadrivalent PF  0.5 mL Intramuscular Tomorrow-1000  . pantoprazole  40 mg Oral Daily  . piperacillin-tazobactam (ZOSYN)  IV  3.375 g Intravenous Q8H   Continuous: . dextrose 5 % and 0.9% NaCl     ZOX:[id number], promethazine Anti-infectives   Start     Dose/Rate Route Frequency Ordered Stop   08/19/13 1800  piperacillin-tazobactam (ZOSYN) IVPB 3.375 g  Status:  Discontinued     3.375 g 12.5 mL/hr over 240 Minutes Intravenous 4 times per day 08/19/13 1523 08/19/13 1530   08/19/13 1600  piperacillin-tazobactam (ZOSYN) IVPB 3.375 g     3.375 g 12.5 mL/hr over 240 Minutes Intravenous Every 8 hours 08/19/13 1531        No results found for this or any previous visit (  from the past 48 hour(s)).  Ct Abdomen Pelvis W Contrast  08/19/2013   CLINICAL DATA:  Abdominal pain, nausea, vomiting  EXAM: CT ABDOMEN AND PELVIS WITH CONTRAST  TECHNIQUE: Multidetector CT imaging of the abdomen and pelvis was performed using the standard protocol following bolus administration of intravenous contrast.  CONTRAST:  OMNIPAQUE IOHEXOL 300 MG/ML  SOLN  COMPARISON:  08/01/2013 acute abdominal series  FINDINGS: Clear lung bases. Trace dependent right pleural effusion. Normal heart size. No hiatal hernia.  Abdomen: Throughout the posterior aspect of the right liver, there is a complex septated enhancing low-attenuation large area measuring 9.4 x  11.3 x 9 cm. This is most compatible with a complex hepatic abscess, less likely considerations include complex biloma, hematoma, or necrotic neoplastic process.  No biliary dilatation.  Hepatic and portal veins are patent.  Collapsed gallbladder, biliary system, pancreas, spleen, adrenal glands, and kidneys are within normal limits for age and demonstrate no acute process.  Negative for bowel obstruction, dilatation, ileus, or free air. Left descending colon and sigmoid diverticulosis evident.  No abdominal free fluid, adenopathy, or hemorrhage.  Negative for aneurysm or acute vascular process.  Pelvis: Sigmoid diverticulosis again evident. Urinary bladder under distended. No pelvic fluid collection, hemorrhage, additional abscess, adenopathy, inguinal abnormality, or hernia.  No acute osseous finding  IMPRESSION: 11 cm posterior right hepatic complex enhancing low-attenuation hepatic abnormality, consistent with an hepatic abscess.  This would be amenable to image guided aspiration and drainage.  Critical Value/emergent results were called by telephone at the time of interpretation on 08/19/2013 at 9:46 AM to Dr.ROBERT [facility] , who verbally acknowledged these results.   Electronically Signed   By: Ruel Favors M.D.   On: 08/19/2013 09:48    Review of Systems  Constitutional: Positive for weight loss (61 pound weight loss since May 2014). Negative for fever, chills, malaise/fatigue and diaphoresis.  HENT: Negative.   Eyes:       Wears contacts  Respiratory: Negative.   Cardiovascular: Negative.   Gastrointestinal: Positive for heartburn, nausea, vomiting and diarrhea. Negative for blood in stool and melena.       Rectal pain, better the last couple weeks, but longstanding.  Genitourinary: Negative.   Musculoskeletal: Negative.   Skin: Positive for itching.  Neurological: Negative.  Negative for weakness.  Endo/Heme/Allergies: Negative.   Psychiatric/Behavioral: Positive for depression. Negative  for suicidal ideas, hallucinations, memory loss and substance abuse. The patient is nervous/anxious. The patient does not have insomnia.        He has a long history of depression and is being followed by Dr. Barnett Abu, with a diagnosis of bipolar depression.   Blood pressure 128/71, pulse 92, temperature 101.2 F (38.4 C), temperature source Oral, resp. rate 20, height 5\' 11"  (1.803 m), weight 112.7 kg (248 lb 7.3 oz), SpO2 99.00%. Physical Exam  Constitutional: He is oriented to person, place, and time.  Obese WM BMI 33.3, NAD, feels febrile now BP 128/71  Pulse 92  Temp(Src) 101.2 F (38.4 C) (Oral)  Resp 20  Ht 5\' 11"  (1.803 m)  Wt 112.7 kg (248 lb 7.3 oz)  BMI 34.67 kg/m2  SpO2 99%   HENT:  Head: Normocephalic and atraumatic.  Nose: Nose normal.  Eyes: Conjunctivae and EOM are normal. Pupils are equal, round, and reactive to light. Right eye exhibits no discharge. Left eye exhibits no discharge. No scleral icterus.  Neck: Normal range of motion. Neck supple. No JVD present. No tracheal deviation present. No thyromegaly present.  Cardiovascular: Normal rate, regular rhythm, normal heart sounds and intact distal pulses.  Exam reveals no gallop.   No murmur heard. Respiratory: Effort normal and breath sounds normal. No respiratory distress. He has no wheezes. He has no rales. He exhibits no tenderness.  GI: Soft. Bowel sounds are normal. He exhibits no distension and no mass. There is tenderness (He is tender on palpation to the RUQ, and some minimal tenderness over LUQ). There is no rebound and no guarding.  Genitourinary:  Rectal exam by Dr. Arlyce Dice on 08/16/13 shows a rectal fissure.  Musculoskeletal: He exhibits no edema and no tenderness.  Lymphadenopathy:    He has no cervical adenopathy.  Neurological: He is alert and oriented to person, place, and time. No cranial nerve deficit.  Skin: Skin is warm and dry. No rash noted. No erythema. No pallor.  Psychiatric: He has a  normal mood and affect. His behavior is normal. Judgment and thought content normal.    Assessment/Plan: 1. Liver Abscess 2.  Anal fissure 3.  Gastritis/esophagitis 4.  61 pound weight loss since May 2014 5.  BMI 33.3 6.  Sleep Apnea, CPAP intolerant 7.  IBS hx  Plan:  Dr. Arlyce Dice talked with Dr. Maisie Fus about the Anal fissure and she would like to keep him on stool softeners, Stiz baths, and follow up in the office.  IR is seeing the patient and plan to place a drain tomorrow to abscess site.  ID is being consulted to evaluate and help with antibiotics. There is some concern that the abscess is septated and drain by IR would not be possible.  I will add sitz baths, fiber and stool softeners prn, he is having loose stools now, to current medications also. Add fiber also Jaysin Gayler 08/19/2013, 3:16 PM

## 2013-08-19 NOTE — Consult Note (Signed)
[facility]  Total days of antibiotics 0               Reason for Consult: hepatic abscess    Referring Physician: kaplan  Active Problems:   Liver mass    HPI: Nicholas Bradford is a 37 y.o. male  with history of schizoaffective disorder, IBS and sleep apnea referred for evaluation of chronic nausea, vomiting and rectal pain. He was recently referred to [facility] for evaluation of significant nausea, vomiting with associated weight loss,  of  reported 50 pounds over the past 6 months.he reports having nightsweats and feeling hote for the last few weeks . He denies abdominal pain, per se. He has burning chest discomfort that is relieved with Pepcid. He denies dysphagia.  Bowel movements are intermittently diarhea like to constipation. Has history of hemorrhoids. During most recent evaluation  He is found to  a deep anal fissure emanating from the anus down to the level of the muscularis,. He had EGD on November 18 that demonstrated mild gastritis. Sigmoidoscopy showed diverticulosis, internal and external hemorrhoids. ABd CT scan found a complex septated hepatic abscess measuring 9.4 x 11.3 x 9 cm. . Patient is admitted for further evaluation and therapy. He has been evaluated by general surgery, IR, and infectious diseases included for antibiotic management. IR to drain abscess tomorrow. Patient is afebrile, hemodynamically stable but has leukocytosis of 14K. Blood cultures pending. He did have teeth cleaning 2 wks ago, has cracked wisdom teeth, with remote history of abscess. No international travel   Past Medical History  Diagnosis Date  . GERD (gastroesophageal reflux disease)   . Schizoaffective disorder 07/01/2013  . Irritable bowel syndrome (IBS)   . Morbid obesity 07/01/2013  . Obstructive sleep apnea 07/01/2013    Allergies: No Known Allergies  MEDICATIONS: . clonazePAM  0.5 mg Oral QID  . [START ON 08/20/2013] influenza vac split quadrivalent PF  0.5 mL  Intramuscular Tomorrow-1000  . pantoprazole  40 mg Oral Daily  . piperacillin-tazobactam (ZOSYN)  IV  3.375 g Intravenous Q8H    History  Substance Use Topics  . Smoking status: Former Smoker -- 0.25 packs/day for .3 years    Types: Cigarettes    Quit date: 09/30/1995  . Smokeless tobacco: Never Used     Comment: Pt only smoked x 3-4 months total. 2nd hand smoke exposure x 30+years.  . Alcohol Use: 0.5 oz/week    1 drink(s) per week    Family History  Problem Relation Age of Onset  . Breast cancer Mother   . Alcohol abuse Father   . Heart disease Paternal Grandfather     Review of Systems  Constitutional: positive for nightsweats and diaphoresis adn weight loss. negative for fever, chills,  activity change, appetite change, fatigue  HENT: Negative for congestion, sore throat, rhinorrhea, sneezing, trouble swallowing and sinus pressure.  Eyes: Negative for photophobia and visual disturbance.  Respiratory: Negative for cough, chest tightness, shortness of breath, wheezing and stridor.  Cardiovascular: Negative for chest pain, palpitations and leg swelling.  Gastrointestinal: positive for nausea, vomiting, but negative abdominal pain, diarrhea, constipation, blood in stool, abdominal distention and anal bleeding.  Genitourinary: Negative for dysuria, hematuria, flank pain and difficulty urinating.  Musculoskeletal: Negative for myalgias, back pain, joint swelling, arthralgias and gait problem.  Skin: Negative for color change, pallor, rash and wound.  Neurological: Negative for dizziness, tremors, weakness and light-headedness.  Hematological: Negative for adenopathy. Does not bruise/bleed easily.  Psychiatric/Behavioral:  Negative for behavioral problems, confusion, sleep disturbance, dysphoric mood, decreased concentration and agitation.     OBJECTIVE: Temp:  [99.9 F (37.7 C)-101.2 F (38.4 C)] 101.2 F (38.4 C) (11/21 1500) Pulse Rate:  [92-105] 92 (11/21 1500) Resp:   [18-20] 20 (11/21 1500) BP: (102-128)/(68-71) 128/71 mmHg (11/21 1500) SpO2:  [97 %-99 %] 99 % (11/21 1500) Weight:  [248 lb 7.3 oz (112.7 kg)] 248 lb 7.3 oz (112.7 kg) (11/21 1130) Physical Exam  Constitutional: He is oriented to person, place, and time. He appears well-developed and well-nourished. No distress.  HENT: pale conjunctiva Mouth/Throat: Oropharynx is clear and moist. No oropharyngeal exudate.  Cardiovascular: Normal rate, regular rhythm and normal heart sounds. Exam reveals no gallop and no friction rub.  No murmur heard.  Pulmonary/Chest: Effort normal and breath sounds normal. No respiratory distress. He has no wheezes.  Abdominal: Soft.protuberant abdomen. Bowel sounds are normal. He exhibits no distension. There is no tenderness.  Lymphadenopathy:  He has no cervical adenopathy.  Neurological: He is alert and oriented to person, place, and time.  Skin: Skin is warm and dry. No rash noted. No erythema.  Psychiatric: He has a normal mood and affect. His behavior is normal.  Rectal exam = deferred, since already examined by other team  LABS: CBC    Component Value Date/Time   WBC 14.9* 08/17/2013 1024   RBC 3.94* 08/17/2013 1024   HGB 11.1* 08/17/2013 1024   HCT 33.3* 08/17/2013 1024   PLT 323.0 08/17/2013 1024   MCV 84.5 08/17/2013 1024   MCH 29.5 08/01/2013 1421   MCHC 33.5 08/17/2013 1024   RDW 15.1* 08/17/2013 1024   LYMPHSABS 3.4 08/17/2013 1024   MONOABS 1.0 08/17/2013 1024   EOSABS 0.1 08/17/2013 1024   BASOSABS 0.1 08/17/2013 1024   BMET    Component Value Date/Time   NA 129* 08/17/2013 1024   K 4.3 08/17/2013 1024   CL 94* 08/17/2013 1024   CO2 27 08/17/2013 1024   GLUCOSE 111* 08/17/2013 1024   BUN 7 08/17/2013 1024   CREATININE 0.9 08/17/2013 1024   CALCIUM 8.7 08/17/2013 1024   GFRNONAA >90 08/01/2013 1421   GFRAA >90 08/01/2013 1421      MICRO: none IMAGING: Ct Abdomen Pelvis W Contrast  08/19/2013   CLINICAL DATA:  Abdominal pain,  nausea, vomiting  EXAM: CT ABDOMEN AND PELVIS WITH CONTRAST  TECHNIQUE: Multidetector CT imaging of the abdomen and pelvis was performed using the standard protocol following bolus administration of intravenous contrast.  CONTRAST:  OMNIPAQUE IOHEXOL 300 MG/ML  SOLN  COMPARISON:  08/01/2013 acute abdominal series  FINDINGS: Clear lung bases. Trace dependent right pleural effusion. Normal heart size. No hiatal hernia.  Abdomen: Throughout the posterior aspect of the right liver, there is a complex septated enhancing low-attenuation large area measuring 9.4 x 11.3 x 9 cm. This is most compatible with a complex hepatic abscess, less likely considerations include complex biloma, hematoma, or necrotic neoplastic process.  No biliary dilatation.  Hepatic and portal veins are patent.  Collapsed gallbladder, biliary system, pancreas, spleen, adrenal glands, and kidneys are within normal limits for age and demonstrate no acute process.  Negative for bowel obstruction, dilatation, ileus, or free air. Left descending colon and sigmoid diverticulosis evident.  No abdominal free fluid, adenopathy, or hemorrhage.  Negative for aneurysm or acute vascular process.  Pelvis: Sigmoid diverticulosis again evident. Urinary bladder under distended. No pelvic fluid collection, hemorrhage, additional abscess, adenopathy, inguinal abnormality, or hernia.  No acute  osseous finding  IMPRESSION: 11 cm posterior right hepatic complex enhancing low-attenuation hepatic abnormality, consistent with an hepatic abscess.  This would be amenable to image guided aspiration and drainage.  Critical Value/emergent results were called by telephone at the time of interpretation on 08/19/2013 at 9:46 AM to Dr.ROBERT [facility] , who verbally acknowledged these results.   Electronically Signed   By: Ruel Favors M.D.   On: 08/19/2013 09:48    Assessment/Plan:  37yo M with chronic N/V, IBS, anal fissure reports constant vomiting, nightsweats and  signficant weight loss found to have larg 9.4 x 11 x 9 cm hepatic abscess of right liver. Perplexed that he would have 50 lb weight loss over 2 months from liver abscess  - check cbc with diff, cmp, and hiv - will hold off on antibiotics until specimen collected when IR is able to place drain, since patient is hemodynamically stable - can start of piptazo for empiric antibiotics after IR has sent sample - please send fluid for aerobic/anaerobic culture. - please collect blood cultures x 2  Dr. Daiva Eves to provider further recs over hte weekend.  Duke Salvia Drue Second MD MPH [facility] [phone]

## 2013-08-19 NOTE — Progress Notes (Signed)
Patient ID: Nicholas Bradford, male   DOB: Jan 08, 1976, 37 y.o.   MRN: [id number] Request received for CT guided drainage of a right hepatic lobe abscess in pt with hx of IBS, perianal ulcer, mild rt lower abd pain,  leukocytosis, night sweats, wt loss, low grade fever, N/V. Imaging studies were reviewed by Dr. Fredia Sorrow. Additional PMH as below. Exam: Pt awake/alert; chest- CTA bilat., heart-RRR; abd- obese,soft,+BS, mildly tender right mid to lower quadrants; ext- FROM.                                                                                                                                                              Past Medical History  Diagnosis Date  . GERD (gastroesophageal reflux disease)   . Schizoaffective disorder 07/01/2013  . Irritable bowel syndrome (IBS)   . Morbid obesity 07/01/2013  . Obstructive sleep apnea 07/01/2013   Past Surgical History  Procedure Laterality Date  . Appendectomy  1990   Ct Abdomen Pelvis W Contrast  08/19/2013   CLINICAL DATA:  Abdominal pain, nausea, vomiting  EXAM: CT ABDOMEN AND PELVIS WITH CONTRAST  TECHNIQUE: Multidetector CT imaging of the abdomen and pelvis was performed using the standard protocol following bolus administration of intravenous contrast.  CONTRAST:  OMNIPAQUE IOHEXOL 300 MG/ML  SOLN  COMPARISON:  08/01/2013 acute abdominal series  FINDINGS: Clear lung bases. Trace dependent right pleural effusion. Normal heart size. No hiatal hernia.  Abdomen: Throughout the posterior aspect of the right liver, there is a complex septated enhancing low-attenuation large area measuring 9.4 x 11.3 x 9 cm. This is most compatible with a complex hepatic abscess, less likely considerations include complex biloma, hematoma, or necrotic neoplastic process.  No biliary dilatation.  Hepatic and portal veins are patent.  Collapsed gallbladder, biliary system, pancreas, spleen, adrenal glands, and kidneys are within normal limits for age and demonstrate no acute  process.  Negative for bowel obstruction, dilatation, ileus, or free air. Left descending colon and sigmoid diverticulosis evident.  No abdominal free fluid, adenopathy, or hemorrhage.  Negative for aneurysm or acute vascular process.  Pelvis: Sigmoid diverticulosis again evident. Urinary bladder under distended. No pelvic fluid collection, hemorrhage, additional abscess, adenopathy, inguinal abnormality, or hernia.  No acute osseous finding  IMPRESSION: 11 cm posterior right hepatic complex enhancing low-attenuation hepatic abnormality, consistent with an hepatic abscess.  This would be amenable to image guided aspiration and drainage.  Critical Value/emergent results were called by telephone at the time of interpretation on 08/19/2013 at 9:46 AM to Dr.ROBERT [facility] , who verbally acknowledged these results.   Electronically Signed   By: Ruel Favors M.D.   On: 08/19/2013 09:48   Dg Abd Acute W/chest  08/01/2013   ADDENDUM REPORT: 08/01/2013 16:06  ADDENDUM: I inadvertently stated that the round  calcification in the midline of the pelvis might be a calcified uterine fibroid. That is, of course, not possible in this male patient, and is therefore most likely a phlebolith or a calcified epiploic appendage adjacent to the sigmoid colon. This was discussed by telephone with Dr. Renae Gloss of the emergency department.   Electronically Signed   By: Hulan Saas M.D.   On: 08/01/2013 16:06   08/01/2013   CLINICAL DATA:  Initial encounter for low abdominal pain, nausea/vomiting, and diarrhea. Current history of irritable bowel syndrome.  EXAM: ACUTE ABDOMEN SERIES (ABDOMEN 2 VIEW & CHEST 1 VIEW)  COMPARISON:  None.  FINDINGS: Bowel gas pattern unremarkable without evidence of obstruction or significant ileus. No evidence of free air or significant air-fluid levels on the erect image. Air-fluid levels in the colon consistent liquid stool in this patient with given history of diarrhea. Round calcification in the  midline of the pelvis may be a large phlebolith or a degenerated uterine fibroid. No abnormal calcifications elsewhere, specifically, no visible opaque urinary tract calculi. Regional skeleton intact.  Cardiomediastinal silhouette unremarkable. Lungs clear. Bronchovascular markings normal. Pulmonary vascularity normal. No visible pleural effusions. No pneumothorax. Mild elevation of the right hemidiaphragm.  IMPRESSION: No acute abdominal or pulmonary abnormality.  Electronically Signed: By: Hulan Saas M.D. On: 08/01/2013 15:57  Results for orders placed in visit on 08/17/13  COMPREHENSIVE METABOLIC PANEL      Result Value Range   Sodium 129 (*) 135 - 145 mEq/L   Potassium 4.3  3.5 - 5.1 mEq/L   Chloride 94 (*) 96 - 112 mEq/L   CO2 27  19 - 32 mEq/L   Glucose, Bld 111 (*) 70 - 99 mg/dL   BUN 7  6 - 23 mg/dL   Creatinine, Ser 0.9  0.4 - 1.5 mg/dL   Total Bilirubin 0.9  0.3 - 1.2 mg/dL   Alkaline Phosphatase 82  39 - 117 U/L   AST 25  0 - 37 U/L   ALT 22  0 - 53 U/L   Total Protein 7.5  6.0 - 8.3 g/dL   Albumin 2.4 (*) 3.5 - 5.2 g/dL   Calcium 8.7  8.4 - 16.1 mg/dL   GFR 096.04  >54.09 mL/min  CBC WITH DIFFERENTIAL      Result Value Range   WBC 14.9 (*) 4.5 - 10.5 K/uL   RBC 3.94 (*) 4.22 - 5.81 Mil/uL   Hemoglobin 11.1 (*) 13.0 - 17.0 g/dL   HCT 81.1 (*) 91.4 - 78.2 %   MCV 84.5  78.0 - 100.0 fl   MCHC 33.5  30.0 - 36.0 g/dL   RDW 95.6 (*) 21.3 - 08.6 %   Platelets 323.0  150.0 - 400.0 K/uL   Neutrophils Relative % 68.9  43.0 - 77.0 %   Lymphocytes Relative 23.0  12.0 - 46.0 %   Monocytes Relative 6.9  3.0 - 12.0 %   Eosinophils Relative 0.6  0.0 - 5.0 %   Basophils Relative 0.6  0.0 - 3.0 %   Neutro Abs 10.3 (*) 1.4 - 7.7 K/uL   Lymphs Abs 3.4  0.7 - 4.0 K/uL   Monocytes Absolute 1.0  0.1 - 1.0 K/uL   Eosinophils Absolute 0.1  0.0 - 0.7 K/uL   Basophils Absolute 0.1  0.0 - 0.1 K/uL   A/P: Pt with hx leukocytosis, mild RLQ abd pain, night sweats, wt loss, low grade fever,  N/V, IBS, deep perianal ulceration and recent finding of a right hepatic  lobe abscess on CT. Plan is for CT guided drainage of abscess on 11/22. Details/risks of procedure d/w pt/wife with their understanding and consent.

## 2013-08-20 ENCOUNTER — Inpatient Hospital Stay (HOSPITAL_COMMUNITY): Payer: No Typology Code available for payment source

## 2013-08-20 DIAGNOSIS — K750 Abscess of liver: Secondary | ICD-10-CM

## 2013-08-20 LAB — CBC
HCT: 28.6 % — ABNORMAL LOW (ref 39.0–52.0)
Hemoglobin: 9.4 g/dL — ABNORMAL LOW (ref 13.0–17.0)
MCH: 27.8 pg (ref 26.0–34.0)
MCV: 84.6 fL (ref 78.0–100.0)
Platelets: 262 10*3/uL (ref 150–400)
RBC: 3.38 MIL/uL — ABNORMAL LOW (ref 4.22–5.81)
WBC: 13.1 10*3/uL — ABNORMAL HIGH (ref 4.0–10.5)

## 2013-08-20 LAB — PROTIME-INR
INR: 1.36 (ref 0.00–1.49)
Prothrombin Time: 16.4 seconds — ABNORMAL HIGH (ref 11.6–15.2)

## 2013-08-20 LAB — HIV ANTIBODY (ROUTINE TESTING W REFLEX): HIV: NONREACTIVE

## 2013-08-20 MED ORDER — FENTANYL CITRATE 0.05 MG/ML IJ SOLN
INTRAMUSCULAR | Status: AC | PRN
  Administered 2013-08-20 (×2): 50 ug via INTRAVENOUS
  Administered 2013-08-20: 100 ug via INTRAVENOUS
  Administered 2013-08-20: 50 ug via INTRAVENOUS

## 2013-08-20 MED ORDER — FENTANYL CITRATE 0.05 MG/ML IJ SOLN
INTRAMUSCULAR | Status: AC
  Filled 2013-08-20: qty 6

## 2013-08-20 MED ORDER — MIDAZOLAM HCL 2 MG/2ML IJ SOLN
INTRAMUSCULAR | Status: AC
  Filled 2013-08-20: qty 6

## 2013-08-20 MED ORDER — MIDAZOLAM HCL 2 MG/2ML IJ SOLN
INTRAMUSCULAR | Status: AC | PRN
  Administered 2013-08-20 (×6): 1 mg via INTRAVENOUS

## 2013-08-20 NOTE — Progress Notes (Signed)
[facility] Progress Note    Since last GI note: Comfortable, mother and wife in room.  Awaits IR drain placement today.   Appreciate ID input from yesterday.  Objective: Vital signs in last 24 hours: Temp:  [99.3 F (37.4 C)-101.2 F (38.4 C)] 99.3 F (37.4 C) (11/22 0615) Pulse Rate:  [89-96] 96 (11/22 0615) Resp:  [18-20] 18 (11/22 0615) BP: (106-128)/(69-71) 111/69 mmHg (11/22 0615) SpO2:  [96 %-99 %] 96 % (11/22 0615) Last BM Date: 08/19/13 General: alert and oriented times 3 Heart: regular rate and rythm Abdomen: soft, non-tender, non-distended, normal bowel sounds   Lab Results:  Recent Labs  08/19/13 1630 08/20/13 0515  WBC 13.0* 13.1*  HGB 9.2* 9.4*  PLT 320 262  MCV 85.5 84.6    Recent Labs  08/19/13 1630  NA 130*  K 4.3  CL 95*  CO2 24  GLUCOSE 103*  BUN 5*  CREATININE 0.76  CALCIUM 8.9    Recent Labs  08/19/13 1630  PROT 6.9  ALBUMIN 2.2*  AST 22  ALT 14  ALKPHOS 83  BILITOT 0.5    Recent Labs  08/20/13 0515  INR 1.36   Medications: Scheduled Meds: . clonazePAM  0.5 mg Oral QID  . Gerhardt's butt cream   Topical TID  . influenza vac split quadrivalent PF  0.5 mL Intramuscular Tomorrow-1000  . pantoprazole  40 mg Oral Daily  . piperacillin-tazobactam (ZOSYN)  IV  3.375 g Intravenous Q8H   Continuous Infusions: . dextrose 5 % and 0.9% NaCl 50 mL/hr at 08/20/13 0700   PRN Meds:.acetaminophen, docusate sodium, HYDROcodone-acetaminophen, promethazine, psyllium    Assessment/Plan: 37 y.o. male (likely) liver abscess  For IR drain placement today.  Will plan to start IV abx (zosyn) when he returns from drain placement.  He can resume general diet after drain as well.  RN aware.   Rachael Fee, MD  08/20/2013, 12:44 PM [facility] Pager [phone]

## 2013-08-20 NOTE — Procedures (Signed)
Procedure:  CT guided drainage of liver Findings:  Large, multiloculated hepatic abscess yielded grossly purulent fluid.  Sample sent for culture.  12 Fr perc drain placed and attached to suction bulb drainage. Will follow output.

## 2013-08-20 NOTE — Progress Notes (Signed)
Subjective: Not having as much RUQ pain.  Objective: Vital signs in last 24 hours: Temp:  [99.3 F (37.4 C)-101.2 F (38.4 C)] 99.3 F (37.4 C) (11/22 0615) Pulse Rate:  [89-105] 96 (11/22 0615) Resp:  [18-20] 18 (11/22 0615) BP: (102-128)/(68-71) 111/69 mmHg (11/22 0615) SpO2:  [96 %-99 %] 96 % (11/22 0615) Weight:  [248 lb 7.3 oz (112.7 kg)] 248 lb 7.3 oz (112.7 kg) (11/21 1130) Last BM Date: 08/19/13  Intake/Output from previous day: 11/21 0701 - 11/22 0700 In: 712.5 [I.V.:712.5] Out: -  Intake/Output this shift:    PE: General- In NAD Abdomen-soft, less RUQ tenderness  Lab Results:   Recent Labs  08/19/13 1630 08/20/13 0515  WBC 13.0* 13.1*  HGB 9.2* 9.4*  HCT 28.2* 28.6*  PLT 320 262   BMET  Recent Labs  08/17/13 1024 08/19/13 1630  NA 129* 130*  K 4.3 4.3  CL 94* 95*  CO2 27 24  GLUCOSE 111* 103*  BUN 7 5*  CREATININE 0.9 0.76  CALCIUM 8.7 8.9   PT/INR  Recent Labs  08/20/13 0515  LABPROT 16.4*  INR 1.36   Comprehensive Metabolic Panel:    Component Value Date/Time   NA 130* 08/19/2013 1630   K 4.3 08/19/2013 1630   CL 95* 08/19/2013 1630   CO2 24 08/19/2013 1630   BUN 5* 08/19/2013 1630   CREATININE 0.76 08/19/2013 1630   GLUCOSE 103* 08/19/2013 1630   CALCIUM 8.9 08/19/2013 1630   AST 22 08/19/2013 1630   ALT 14 08/19/2013 1630   ALKPHOS 83 08/19/2013 1630   BILITOT 0.5 08/19/2013 1630   PROT 6.9 08/19/2013 1630   ALBUMIN 2.2* 08/19/2013 1630     Studies/Results: Ct Abdomen Pelvis W Contrast  08/19/2013   CLINICAL DATA:  Abdominal pain, nausea, vomiting  EXAM: CT ABDOMEN AND PELVIS WITH CONTRAST  TECHNIQUE: Multidetector CT imaging of the abdomen and pelvis was performed using the standard protocol following bolus administration of intravenous contrast.  CONTRAST:  OMNIPAQUE IOHEXOL 300 MG/ML  SOLN  COMPARISON:  08/01/2013 acute abdominal series  FINDINGS: Clear lung bases. Trace dependent right pleural effusion.  Normal heart size. No hiatal hernia.  Abdomen: Throughout the posterior aspect of the right liver, there is a complex septated enhancing low-attenuation large area measuring 9.4 x 11.3 x 9 cm. This is most compatible with a complex hepatic abscess, less likely considerations include complex biloma, hematoma, or necrotic neoplastic process.  No biliary dilatation.  Hepatic and portal veins are patent.  Collapsed gallbladder, biliary system, pancreas, spleen, adrenal glands, and kidneys are within normal limits for age and demonstrate no acute process.  Negative for bowel obstruction, dilatation, ileus, or free air. Left descending colon and sigmoid diverticulosis evident.  No abdominal free fluid, adenopathy, or hemorrhage.  Negative for aneurysm or acute vascular process.  Pelvis: Sigmoid diverticulosis again evident. Urinary bladder under distended. No pelvic fluid collection, hemorrhage, additional abscess, adenopathy, inguinal abnormality, or hernia.  No acute osseous finding  IMPRESSION: 11 cm posterior right hepatic complex enhancing low-attenuation hepatic abnormality, consistent with an hepatic abscess.  This would be amenable to image guided aspiration and drainage.  Critical Value/emergent results were called by telephone at the time of interpretation on 08/19/2013 at 9:46 AM to Dr.ROBERT [facility] , who verbally acknowledged these results.   Electronically Signed   By: Ruel Favors M.D.   On: 08/19/2013 09:48    Anti-infectives: Anti-infectives   Start     Dose/Rate Route Frequency Ordered  Stop   08/20/13 1000  piperacillin-tazobactam (ZOSYN) IVPB 3.375 g     3.375 g 12.5 mL/hr over 240 Minutes Intravenous Every 8 hours 08/19/13 1615     08/20/13 0600  piperacillin-tazobactam (ZOSYN) IVPB 3.375 g  Status:  Discontinued     3.375 g 100 mL/hr over 30 Minutes Intravenous 3 times per day 08/19/13 1557 08/19/13 1614   08/19/13 1800  piperacillin-tazobactam (ZOSYN) IVPB 3.375 g  Status:  Discontinued      3.375 g 12.5 mL/hr over 240 Minutes Intravenous 4 times per day 08/19/13 1523 08/19/13 1530   08/19/13 1600  piperacillin-tazobactam (ZOSYN) IVPB 3.375 g  Status:  Discontinued     3.375 g 12.5 mL/hr over 240 Minutes Intravenous Every 8 hours 08/19/13 1531 08/19/13 1556      Assessment Active Problems:   Liver mass concerning for abscess   Perianal ulcerations-pain improved considerably with butt balm    LOS: 1 day   Plan: Await results of IR procedure.   Mcihael Hinderman J 08/20/2013

## 2013-08-21 NOTE — Progress Notes (Signed)
Subjective: Anal area feels better.  Some night sweats.  Objective: Vital signs in last 24 hours: Temp:  [97.8 F (36.6 C)-99.5 F (37.5 C)] 97.8 F (36.6 C) (11/23 0532) Pulse Rate:  [75-102] 75 (11/23 0532) Resp:  [10-31] 18 (11/23 0532) BP: (110-123)/(55-77) 116/73 mmHg (11/23 0532) SpO2:  [90 %-98 %] 97 % (11/23 0532) Last BM Date: 08/19/13  Intake/Output from previous day: 11/22 0701 - 11/23 0700 In: 1440 [P.O.:240; I.V.:1200] Out: 760 [Urine:400; Drains:120] Intake/Output this shift: Total I/O In: 360 [P.O.:360] Out: -   PE: General- In NAD Abdomen-soft, RUQ drain with purulent output Anorectal-no change in perianal skin ulcerations  Lab Results:   Recent Labs  08/19/13 1630 08/20/13 0515  WBC 13.0* 13.1*  HGB 9.2* 9.4*  HCT 28.2* 28.6*  PLT 320 262   BMET  Recent Labs  08/19/13 1630  NA 130*  K 4.3  CL 95*  CO2 24  GLUCOSE 103*  BUN 5*  CREATININE 0.76  CALCIUM 8.9   PT/INR  Recent Labs  08/20/13 0515  LABPROT 16.4*  INR 1.36   Comprehensive Metabolic Panel:    Component Value Date/Time   NA 130* 08/19/2013 1630   K 4.3 08/19/2013 1630   CL 95* 08/19/2013 1630   CO2 24 08/19/2013 1630   BUN 5* 08/19/2013 1630   CREATININE 0.76 08/19/2013 1630   GLUCOSE 103* 08/19/2013 1630   CALCIUM 8.9 08/19/2013 1630   AST 22 08/19/2013 1630   ALT 14 08/19/2013 1630   ALKPHOS 83 08/19/2013 1630   BILITOT 0.5 08/19/2013 1630   PROT 6.9 08/19/2013 1630   ALBUMIN 2.2* 08/19/2013 1630     Studies/Results: Ct Guided Abscess Drain  08/20/2013   CLINICAL DATA:  Hepatic abscess requiring percutaneous drainage.  EXAM: CT GUIDED DRAINAGE OF HEPATIC ABSCESS  ANESTHESIA/SEDATION: 6.0 Mg IV Versed 250 mcg IV Fentanyl  Total Moderate Sedation Time:  30 minutes  PROCEDURE: The procedure, risks, benefits, and alternatives were explained to the patient. Questions regarding the procedure were encouraged and answered. The patient understands and consents  to the procedure.  The right lateral abdominal wall was prepped with Betadine in a sterile fashion, and a sterile drape was applied covering the operative field. A sterile gown and sterile gloves were used for the procedure. Local anesthesia was provided with 1% Lidocaine.  CT was performed in a supine position with the right side role lobe slightly. Under CT guidance, an 18 gauge trocar needle was advanced into the right lobe of the liver. Aspiration was performed and a fluid sample sent for culture analysis. A guidewire was advanced into the collection. The tract was dilated and a 12 [place] percutaneous drain advanced. Drainage catheter position was confirmed by CT.  The drain was flushed with saline and connected to a suction bulb. It was secured at the skin with a Prolene retention suture and StatLock device.  COMPLICATIONS: None  FINDINGS: Purulent fluid was aspirated from the posterior right hepatic abscess. A liquefied pocket was targeted for drainage catheter placement. The 60 French drain is draining well after placement.  IMPRESSION: CT-guided percutaneous drainage of hepatic abscess with placement of indwelling 12 French drain. A purulent fluid sample was sent for culture analysis. Output will be followed.   Electronically Signed   By: Irish Lack M.D.   On: 08/20/2013 17:25    Anti-infectives: Anti-infectives   Start     Dose/Rate Route Frequency Ordered Stop   08/20/13 1000  piperacillin-tazobactam (ZOSYN) IVPB 3.375 g  3.375 g 12.5 mL/hr over 240 Minutes Intravenous Every 8 hours 08/19/13 1615     08/20/13 0600  piperacillin-tazobactam (ZOSYN) IVPB 3.375 g  Status:  Discontinued     3.375 g 100 mL/hr over 30 Minutes Intravenous 3 times per day 08/19/13 1557 08/19/13 1614   08/19/13 1800  piperacillin-tazobactam (ZOSYN) IVPB 3.375 g  Status:  Discontinued     3.375 g 12.5 mL/hr over 240 Minutes Intravenous 4 times per day 08/19/13 1523 08/19/13 1530   08/19/13 1600   piperacillin-tazobactam (ZOSYN) IVPB 3.375 g  Status:  Discontinued     3.375 g 12.5 mL/hr over 240 Minutes Intravenous Every 8 hours 08/19/13 1531 08/19/13 1556      Assessment Active Problems:   Liver abscess-s/p percutaneous drainage; culture pending; Zosyn started   Perianal ulcerations-pain improved considerably with butt balm; they are clean    LOS: 2 days   Plan: Continue perianal care.  Keep outpatient appointment with Dr. Maisie Fus for perianal ulcerations.   Sheriece Jefcoat J 08/21/2013

## 2013-08-21 NOTE — Progress Notes (Signed)
Subjective: Pt doing fairly well; still having occ sweats, mild tenderness at hepatic drain site; denies N/V,diarrhea or resp problems  Objective: Vital signs in last 24 hours: Temp:  [97.8 F (36.6 C)-99.5 F (37.5 C)] 97.8 F (36.6 C) (11/23 0532) Pulse Rate:  [75-102] 75 (11/23 0532) Resp:  [10-31] 18 (11/23 0532) BP: (110-123)/(55-77) 116/73 mmHg (11/23 0532) SpO2:  [90 %-98 %] 97 % (11/23 0532) Last BM Date: 08/19/13  Intake/Output from previous day: 11/22 0701 - 11/23 0700 In: 1440 [P.O.:240; I.V.:1200] Out: 760 [Urine:400; Drains:120] Intake/Output this shift: Total I/O In: 360 [P.O.:360] Out: -   Rt hepatic drain intact, output 120+ cc's purulent beige colored fluid; cx's pending; drain flushed with 10 cc's sterile NS without difficulty  Lab Results:   Recent Labs  08/19/13 1630 08/20/13 0515  WBC 13.0* 13.1*  HGB 9.2* 9.4*  HCT 28.2* 28.6*  PLT 320 262   BMET  Recent Labs  08/19/13 1630  NA 130*  K 4.3  CL 95*  CO2 24  GLUCOSE 103*  BUN 5*  CREATININE 0.76  CALCIUM 8.9   PT/INR  Recent Labs  08/20/13 0515  LABPROT 16.4*  INR 1.36   ABG No results found for this basename: PHART, PCO2, PO2, HCO3,  in the last 72 hours  Studies/Results: Ct Guided Abscess Drain  08/20/2013   CLINICAL DATA:  Hepatic abscess requiring percutaneous drainage.  EXAM: CT GUIDED DRAINAGE OF HEPATIC ABSCESS  ANESTHESIA/SEDATION: 6.0 Mg IV Versed 250 mcg IV Fentanyl  Total Moderate Sedation Time:  30 minutes  PROCEDURE: The procedure, risks, benefits, and alternatives were explained to the patient. Questions regarding the procedure were encouraged and answered. The patient understands and consents to the procedure.  The right lateral abdominal wall was prepped with Betadine in a sterile fashion, and a sterile drape was applied covering the operative field. A sterile gown and sterile gloves were used for the procedure. Local anesthesia was provided with 1% Lidocaine.   CT was performed in a supine position with the right side role lobe slightly. Under CT guidance, an 18 gauge trocar needle was advanced into the right lobe of the liver. Aspiration was performed and a fluid sample sent for culture analysis. A guidewire was advanced into the collection. The tract was dilated and a 12 [place] percutaneous drain advanced. Drainage catheter position was confirmed by CT.  The drain was flushed with saline and connected to a suction bulb. It was secured at the skin with a Prolene retention suture and StatLock device.  COMPLICATIONS: None  FINDINGS: Purulent fluid was aspirated from the posterior right hepatic abscess. A liquefied pocket was targeted for drainage catheter placement. The 57 French drain is draining well after placement.  IMPRESSION: CT-guided percutaneous drainage of hepatic abscess with placement of indwelling 12 French drain. A purulent fluid sample was sent for culture analysis. Output will be followed.   Electronically Signed   By: Irish Lack M.D.   On: 08/20/2013 17:25   Results for orders placed during the hospital encounter of 08/19/13  CULTURE, BLOOD (ROUTINE X 2)     Status: None   Collection Time    08/19/13  2:45 PM      Result Value Range Status   Specimen Description BLOOD RIGHT HAND   Final   Special Requests BOTTLES DRAWN AEROBIC AND ANAEROBIC 10CC   Final   Culture  Setup Time     Final   Value: 08/19/2013 20:18     Performed at [facility]   Culture     Final   Value:        BLOOD CULTURE RECEIVED NO GROWTH TO DATE CULTURE WILL BE HELD FOR 5 DAYS BEFORE ISSUING A FINAL NEGATIVE REPORT     Performed at [facility]   Report Status PENDING   Incomplete  CULTURE, BLOOD (ROUTINE X 2)     Status: None   Collection Time    08/19/13  3:05 PM      Result Value Range Status   Specimen Description BLOOD RIGHT ARM   Final   Special Requests BOTTLES DRAWN AEROBIC AND ANAEROBIC 10CC   Final   Culture  Setup Time     Final    Value: 08/19/2013 20:18     Performed at [facility]   Culture     Final   Value:        BLOOD CULTURE RECEIVED NO GROWTH TO DATE CULTURE WILL BE HELD FOR 5 DAYS BEFORE ISSUING A FINAL NEGATIVE REPORT     Performed at [facility]   Report Status PENDING   Incomplete  CULTURE, ROUTINE-ABSCESS     Status: None   Collection Time    08/20/13  4:21 PM      Result Value Range Status   Specimen Description LIVER   Final   Special Requests Normal   Final   Gram Stain PENDING   Incomplete   Culture     Final   Value: NO GROWTH 1 DAY     Performed at [facility]   Report Status PENDING   Incomplete  ANAEROBIC CULTURE     Status: None   Collection Time    08/20/13  4:21 PM      Result Value Range Status   Specimen Description LIVER   Final   Special Requests Normal   Final   Gram Stain PENDING   Incomplete   Culture     Final   Value: NO ANAEROBES ISOLATED; CULTURE IN PROGRESS FOR 5 DAYS     Performed at [facility]   Report Status PENDING   Incomplete    Anti-infectives: Anti-infectives   Start     Dose/Rate Route Frequency Ordered Stop   08/20/13 1000  piperacillin-tazobactam (ZOSYN) IVPB 3.375 g     3.375 g 12.5 mL/hr over 240 Minutes Intravenous Every 8 hours 08/19/13 1615     08/20/13 0600  piperacillin-tazobactam (ZOSYN) IVPB 3.375 g  Status:  Discontinued     3.375 g 100 mL/hr over 30 Minutes Intravenous 3 times per day 08/19/13 1557 08/19/13 1614   08/19/13 1800  piperacillin-tazobactam (ZOSYN) IVPB 3.375 g  Status:  Discontinued     3.375 g 12.5 mL/hr over 240 Minutes Intravenous 4 times per day 08/19/13 1523 08/19/13 1530   08/19/13 1600  piperacillin-tazobactam (ZOSYN) IVPB 3.375 g  Status:  Discontinued     3.375 g 12.5 mL/hr over 240 Minutes Intravenous Every 8 hours 08/19/13 1531 08/19/13 1556      Assessment/Plan: s/p rt hepatic abscess drainage 11/22; check final cx's/sens ; cont drain irrigation; antbx per primary; recheck f/u  CT once output dimishes  LOS: 2 days    ALLRED,D [facility] 08/21/2013

## 2013-08-21 NOTE — Progress Notes (Signed)
[facility] Progress Note    Since last GI note: CT guided drainage, drain placement of liver abscess;  Frank pus.  He started zosyn after drain was placed by IR.  Slept well except night sweats  Objective: Vital signs in last 24 hours: Temp:  [97.8 F (36.6 C)-99.5 F (37.5 C)] 97.8 F (36.6 C) (11/23 0532) Pulse Rate:  [75-102] 75 (11/23 0532) Resp:  [10-31] 18 (11/23 0532) BP: (110-123)/(55-77) 116/73 mmHg (11/23 0532) SpO2:  [90 %-98 %] 97 % (11/23 0532) Last BM Date: 08/19/13 General: alert and oriented times 3 Heart: regular rate and rythm Abdomen: soft, non-tender, non-distended, normal bowel sounds Right sided JP drain: think cloudy pus in drain;  Looks like 120cc of drain output charted so far  Lab Results:  Recent Labs  08/19/13 1630 08/20/13 0515  WBC 13.0* 13.1*  HGB 9.2* 9.4*  PLT 320 262  MCV 85.5 84.6    Recent Labs  08/19/13 1630  NA 130*  K 4.3  CL 95*  CO2 24  GLUCOSE 103*  BUN 5*  CREATININE 0.76  CALCIUM 8.9    Recent Labs  08/19/13 1630  PROT 6.9  ALBUMIN 2.2*  AST 22  ALT 14  ALKPHOS 83  BILITOT 0.5    Recent Labs  08/20/13 0515  INR 1.36     Studies/Results: Ct Guided Abscess Drain  08/20/2013   CLINICAL DATA:  Hepatic abscess requiring percutaneous drainage.  EXAM: CT GUIDED DRAINAGE OF HEPATIC ABSCESS  ANESTHESIA/SEDATION: 6.0 Mg IV Versed 250 mcg IV Fentanyl  Total Moderate Sedation Time:  30 minutes  PROCEDURE: The procedure, risks, benefits, and alternatives were explained to the patient. Questions regarding the procedure were encouraged and answered. The patient understands and consents to the procedure.  The right lateral abdominal wall was prepped with Betadine in a sterile fashion, and a sterile drape was applied covering the operative field. A sterile gown and sterile gloves were used for the procedure. Local anesthesia was provided with 1% Lidocaine.  CT was performed in a supine position with the  right side role lobe slightly. Under CT guidance, an 18 gauge trocar needle was advanced into the right lobe of the liver. Aspiration was performed and a fluid sample sent for culture analysis. A guidewire was advanced into the collection. The tract was dilated and a 12 [place] percutaneous drain advanced. Drainage catheter position was confirmed by CT.  The drain was flushed with saline and connected to a suction bulb. It was secured at the skin with a Prolene retention suture and StatLock device.  COMPLICATIONS: None  FINDINGS: Purulent fluid was aspirated from the posterior right hepatic abscess. A liquefied pocket was targeted for drainage catheter placement. The 33 French drain is draining well after placement.  IMPRESSION: CT-guided percutaneous drainage of hepatic abscess with placement of indwelling 12 French drain. A purulent fluid sample was sent for culture analysis. Output will be followed.   Electronically Signed   By: Irish Lack M.D.   On: 08/20/2013 17:25   Ct Abdomen Pelvis W Contrast  08/19/2013   CLINICAL DATA:  Abdominal pain, nausea, vomiting  EXAM: CT ABDOMEN AND PELVIS WITH CONTRAST  TECHNIQUE: Multidetector CT imaging of the abdomen and pelvis was performed using the standard protocol following bolus administration of intravenous contrast.  CONTRAST:  OMNIPAQUE IOHEXOL 300 MG/ML  SOLN  COMPARISON:  08/01/2013 acute abdominal series  FINDINGS: Clear lung bases. Trace dependent right pleural effusion. Normal heart size. No hiatal hernia.  Abdomen:  Throughout the posterior aspect of the right liver, there is a complex septated enhancing low-attenuation large area measuring 9.4 x 11.3 x 9 cm. This is most compatible with a complex hepatic abscess, less likely considerations include complex biloma, hematoma, or necrotic neoplastic process.  No biliary dilatation.  Hepatic and portal veins are patent.  Collapsed gallbladder, biliary system, pancreas, spleen, adrenal glands, and kidneys  are within normal limits for age and demonstrate no acute process.  Negative for bowel obstruction, dilatation, ileus, or free air. Left descending colon and sigmoid diverticulosis evident.  No abdominal free fluid, adenopathy, or hemorrhage.  Negative for aneurysm or acute vascular process.  Pelvis: Sigmoid diverticulosis again evident. Urinary bladder under distended. No pelvic fluid collection, hemorrhage, additional abscess, adenopathy, inguinal abnormality, or hernia.  No acute osseous finding  IMPRESSION: 11 cm posterior right hepatic complex enhancing low-attenuation hepatic abnormality, consistent with an hepatic abscess.  This would be amenable to image guided aspiration and drainage.  Critical Value/emergent results were called by telephone at the time of interpretation on 08/19/2013 at 9:46 AM to Dr.ROBERT [facility] , who verbally acknowledged these results.   Electronically Signed   By: Ruel Favors M.D.   On: 08/19/2013 09:48     Medications: Scheduled Meds: . clonazePAM  0.5 mg Oral QID  . Gerhardt's butt cream   Topical TID  . influenza vac split quadrivalent PF  0.5 mL Intramuscular Tomorrow-1000  . pantoprazole  40 mg Oral Daily  . piperacillin-tazobactam (ZOSYN)  IV  3.375 g Intravenous Q8H   Continuous Infusions: . dextrose 5 % and 0.9% NaCl 50 mL/hr at 08/20/13 0700   PRN Meds:.acetaminophen, docusate sodium, HYDROcodone-acetaminophen, promethazine, psyllium    Assessment/Plan: 37 y.o. male with liver abscess  Drain functioning well.  Will defer to IR about timing of repeat imaging, drain care.  When cultures are final, will be able to target Abx appropriately.  I will order PICC line for tomorrow, he will likely need IV abx at home.  HL IV fluids, he is eating well.   Rachael Fee, MD  08/21/2013, 7:38 AM [facility] Pager [phone]

## 2013-08-22 DIAGNOSIS — K602 Anal fissure, unspecified: Secondary | ICD-10-CM

## 2013-08-22 MED ORDER — DILTIAZEM GEL 2 %
Freq: Two times a day (BID) | CUTANEOUS | Status: DC
  Administered 2013-08-22 – 2013-08-23 (×2): via TOPICAL
  Administered 2013-08-24: 1 via TOPICAL
  Filled 2013-08-22: qty 30

## 2013-08-22 MED ORDER — HYDROCORTISONE ACE-PRAMOXINE 2.5-1 % RE CREA
TOPICAL_CREAM | Freq: Three times a day (TID) | RECTAL | Status: DC
  Administered 2013-08-22 (×2): via RECTAL
  Administered 2013-08-23: 1 via RECTAL
  Administered 2013-08-23: 18:00:00 via RECTAL
  Administered 2013-08-23: 1 via RECTAL
  Administered 2013-08-24: 10:00:00 via RECTAL
  Filled 2013-08-22: qty 30

## 2013-08-22 NOTE — Progress Notes (Signed)
Subjective: Doing better, drainage from liver is down, he feels better.  Still pretty sore. Buttocks is less painful, and the diarrhea is better.  He is starting stool softners today.    Objective: Vital signs in last 24 hours: Temp:  [98 F (36.7 C)-98.6 F (37 C)] 98.1 F (36.7 C) (11/24 0526) Pulse Rate:  [91-99] 94 (11/24 0526) Resp:  [16-18] 18 (11/24 0526) BP: (110-127)/(67-83) 110/67 mmHg (11/24 0526) SpO2:  [95 %-98 %] 96 % (11/24 0526) Last BM Date: 08/19/13 840 PO 60 ML from the drain Diet: regular Afebrile, VSS No labs today No growth from cultures of abscess so far. Intake/Output from previous day: 11/23 0701 - 11/24 0700 In: 840 [P.O.:840] Out: 60 [Drains:60] Intake/Output this shift: Total I/O In: -  Out: 15 [Drains:15]  General appearance: Well nourished and no distress. GI: soft, tolerating regular diet, drain has some brownish-white-cloudy fluid in it.   Buttocks: ulcerations left and right perianal area is doing well, buttbalm is being used. No significant discomfort. Lab Results:   Recent Labs  08/19/13 1630 08/20/13 0515  WBC 13.0* 13.1*  HGB 9.2* 9.4*  HCT 28.2* 28.6*  PLT 320 262    BMET  Recent Labs  08/19/13 1630  NA 130*  K 4.3  CL 95*  CO2 24  GLUCOSE 103*  BUN 5*  CREATININE 0.76  CALCIUM 8.9   PT/INR  Recent Labs  08/20/13 0515  LABPROT 16.4*  INR 1.36     Recent Labs Lab 08/17/13 1024 08/19/13 1630  AST 25 22  ALT 22 14  ALKPHOS 82 83  BILITOT 0.9 0.5  PROT 7.5 6.9  ALBUMIN 2.4* 2.2*     Lipase     Component Value Date/Time   LIPASE 37 01/15/2011 1732     Studies/Results: Ct Guided Abscess Drain  08/20/2013   CLINICAL DATA:  Hepatic abscess requiring percutaneous drainage.  EXAM: CT GUIDED DRAINAGE OF HEPATIC ABSCESS  ANESTHESIA/SEDATION: 6.0 Mg IV Versed 250 mcg IV Fentanyl  Total Moderate Sedation Time:  30 minutes  PROCEDURE: The procedure, risks, benefits, and alternatives were explained to  the patient. Questions regarding the procedure were encouraged and answered. The patient understands and consents to the procedure.  The right lateral abdominal wall was prepped with Betadine in a sterile fashion, and a sterile drape was applied covering the operative field. A sterile gown and sterile gloves were used for the procedure. Local anesthesia was provided with 1% Lidocaine.  CT was performed in a supine position with the right side role lobe slightly. Under CT guidance, an 18 gauge trocar needle was advanced into the right lobe of the liver. Aspiration was performed and a fluid sample sent for culture analysis. A guidewire was advanced into the collection. The tract was dilated and a 12 [place] percutaneous drain advanced. Drainage catheter position was confirmed by CT.  The drain was flushed with saline and connected to a suction bulb. It was secured at the skin with a Prolene retention suture and StatLock device.  COMPLICATIONS: None  FINDINGS: Purulent fluid was aspirated from the posterior right hepatic abscess. A liquefied pocket was targeted for drainage catheter placement. The 59 French drain is draining well after placement.  IMPRESSION: CT-guided percutaneous drainage of hepatic abscess with placement of indwelling 12 French drain. A purulent fluid sample was sent for culture analysis. Output will be followed.   Electronically Signed   By: Irish Lack M.D.   On: 08/20/2013 17:25    Medications: .  clonazePAM  0.5 mg Oral QID  . Gerhardt's butt cream   Topical TID  . influenza vac split quadrivalent PF  0.5 mL Intramuscular Tomorrow-1000  . pantoprazole  40 mg Oral Daily  . piperacillin-tazobactam (ZOSYN)  IV  3.375 g Intravenous Q8H    Assessment/Plan 1. Liver Abscess with IR drain placement 08/20/13.   2. Anal fissure  3. Gastritis/esophagitis  4. 61 pound weight loss since May 2014  5. BMI 33.3  6. Sleep Apnea, CPAP intolerant  7. IBS hx 8.  He is not on Chemical  Prophylaxis at this time/he is on SCD's.   Plan:  From a surgical standpoint he is doing well.  He can follow up with Dr. Maisie Fus as an outpatient and we will see again in the hospital if needed.        LOS: 3 days    Cameo Shewell 08/22/2013

## 2013-08-22 NOTE — Progress Notes (Signed)
Subjective: Pt resting quietly; only c/o is mild RUQ soreness at drain site  Objective: Vital signs in last 24 hours: Temp:  [98 F (36.7 C)-98.6 F (37 C)] 98.1 F (36.7 C) (11/24 0526) Pulse Rate:  [91-99] 94 (11/24 0526) Resp:  [16-18] 18 (11/24 0526) BP: (110-127)/(67-83) 110/67 mmHg (11/24 0526) SpO2:  [95 %-98 %] 96 % (11/24 0526) Last BM Date: 08/19/13  Intake/Output from previous day: 11/23 0701 - 11/24 0700 In: 840 [P.O.:840] Out: 60 [Drains:60] Intake/Output this shift: Total I/O In: -  Out: 15 [Drains:15]  Hepatic abscess drain intact, insertion site ok, mildly tender; blood/abscess fluid cx's pend; drain irrigated with 5 cc's sterile NS without difficulty; output about 25 cc's  Lab Results:   Recent Labs  08/19/13 1630 08/20/13 0515  WBC 13.0* 13.1*  HGB 9.2* 9.4*  HCT 28.2* 28.6*  PLT 320 262   BMET  Recent Labs  08/19/13 1630  NA 130*  K 4.3  CL 95*  CO2 24  GLUCOSE 103*  BUN 5*  CREATININE 0.76  CALCIUM 8.9   PT/INR  Recent Labs  08/20/13 0515  LABPROT 16.4*  INR 1.36   ABG No results found for this basename: PHART, PCO2, PO2, HCO3,  in the last 72 hours  Studies/Results: Ct Guided Abscess Drain  08/20/2013   CLINICAL DATA:  Hepatic abscess requiring percutaneous drainage.  EXAM: CT GUIDED DRAINAGE OF HEPATIC ABSCESS  ANESTHESIA/SEDATION: 6.0 Mg IV Versed 250 mcg IV Fentanyl  Total Moderate Sedation Time:  30 minutes  PROCEDURE: The procedure, risks, benefits, and alternatives were explained to the patient. Questions regarding the procedure were encouraged and answered. The patient understands and consents to the procedure.  The right lateral abdominal wall was prepped with Betadine in a sterile fashion, and a sterile drape was applied covering the operative field. A sterile gown and sterile gloves were used for the procedure. Local anesthesia was provided with 1% Lidocaine.  CT was performed in a supine position with the right side  role lobe slightly. Under CT guidance, an 18 gauge trocar needle was advanced into the right lobe of the liver. Aspiration was performed and a fluid sample sent for culture analysis. A guidewire was advanced into the collection. The tract was dilated and a 12 [place] percutaneous drain advanced. Drainage catheter position was confirmed by CT.  The drain was flushed with saline and connected to a suction bulb. It was secured at the skin with a Prolene retention suture and StatLock device.  COMPLICATIONS: None  FINDINGS: Purulent fluid was aspirated from the posterior right hepatic abscess. A liquefied pocket was targeted for drainage catheter placement. The 23 French drain is draining well after placement.  IMPRESSION: CT-guided percutaneous drainage of hepatic abscess with placement of indwelling 12 French drain. A purulent fluid sample was sent for culture analysis. Output will be followed.   Electronically Signed   By: Irish Lack M.D.   On: 08/20/2013 17:25   Results for orders placed during the hospital encounter of 08/19/13  CULTURE, BLOOD (ROUTINE X 2)     Status: None   Collection Time    08/19/13  2:45 PM      Result Value Range Status   Specimen Description BLOOD RIGHT HAND   Final   Special Requests BOTTLES DRAWN AEROBIC AND ANAEROBIC 10CC   Final   Culture  Setup Time     Final   Value: 08/19/2013 20:18     Performed at [facility]  Final   Value:        BLOOD CULTURE RECEIVED NO GROWTH TO DATE CULTURE WILL BE HELD FOR 5 DAYS BEFORE ISSUING A FINAL NEGATIVE REPORT     Performed at [facility]   Report Status PENDING   Incomplete  CULTURE, BLOOD (ROUTINE X 2)     Status: None   Collection Time    08/19/13  3:05 PM      Result Value Range Status   Specimen Description BLOOD RIGHT ARM   Final   Special Requests BOTTLES DRAWN AEROBIC AND ANAEROBIC 10CC   Final   Culture  Setup Time     Final   Value: 08/19/2013 20:18     Performed at [facility]   Culture     Final   Value:        BLOOD CULTURE RECEIVED NO GROWTH TO DATE CULTURE WILL BE HELD FOR 5 DAYS BEFORE ISSUING A FINAL NEGATIVE REPORT     Performed at [facility]   Report Status PENDING   Incomplete  CULTURE, ROUTINE-ABSCESS     Status: None   Collection Time    08/20/13  4:21 PM      Result Value Range Status   Specimen Description LIVER   Final   Special Requests Normal   Final   Gram Stain PENDING   Incomplete   Culture     Final   Value: NO GROWTH 2 DAYS     Performed at [facility]   Report Status PENDING   Incomplete  ANAEROBIC CULTURE     Status: None   Collection Time    08/20/13  4:21 PM      Result Value Range Status   Specimen Description LIVER   Final   Special Requests Normal   Final   Gram Stain PENDING   Incomplete   Culture     Final   Value: NO ANAEROBES ISOLATED; CULTURE IN PROGRESS FOR 5 DAYS     Performed at [facility]   Report Status PENDING   Incomplete    Anti-infectives: Anti-infectives   Start     Dose/Rate Route Frequency Ordered Stop   08/20/13 1000  piperacillin-tazobactam (ZOSYN) IVPB 3.375 g     3.375 g 12.5 mL/hr over 240 Minutes Intravenous Every 8 hours 08/19/13 1615     08/20/13 0600  piperacillin-tazobactam (ZOSYN) IVPB 3.375 g  Status:  Discontinued     3.375 g 100 mL/hr over 30 Minutes Intravenous 3 times per day 08/19/13 1557 08/19/13 1614   08/19/13 1800  piperacillin-tazobactam (ZOSYN) IVPB 3.375 g  Status:  Discontinued     3.375 g 12.5 mL/hr over 240 Minutes Intravenous 4 times per day 08/19/13 1523 08/19/13 1530   08/19/13 1600  piperacillin-tazobactam (ZOSYN) IVPB 3.375 g  Status:  Discontinued     3.375 g 12.5 mL/hr over 240 Minutes Intravenous Every 8 hours 08/19/13 1531 08/19/13 1556      Assessment/Plan: s/p rt hepatic abscess drainage 11/22; check final cx's/ hepatic fluid cytology; monitor labs; once output <15 cc's over 24 hr period obtain f/u CT with IV contrast to  assess adequacy of drainage (tent plan on 11/26 unless output increases)  LOS: 3 days    Emmelia Holdsworth,D [facility] 08/22/2013

## 2013-08-22 NOTE — Progress Notes (Signed)
I have interviewed this patient and examined him. I reviewed his imaging studies. I have discussed his care plan with Dr. Wendall Papa.  I would anticipate a high probability that his liver abscesses will resolve with antibiotics and drainage. Ultimately he will need further GI workup, including upper endoscopy and colonoscopy, to see if a source can be found. He has already had an appendectomy in the past.  For now, we will sign off. We will be happy to reconsult as needed.  Angelia Mould. Derrell Lolling, M.D., [facility] General and Minimally invasive Surgery Breast and Colorectal Surgery Office:   [phone] Pager:   [phone]

## 2013-08-22 NOTE — Progress Notes (Signed)
[facility] Progress Note  Subjective:  Feels ok.  Right side is sore at the drain site.  Had a BM last night that was somewhat large and hard so he has been started on stool softeners prn.  Has been eating but not very hungry this AM.  Drain output down significantly.  Objective:  Vital signs in last 24 hours: Temp:  [98 F (36.7 C)-98.6 F (37 C)] 98.1 F (36.7 C) (11/24 0526) Pulse Rate:  [91-99] 94 (11/24 0526) Resp:  [16-18] 18 (11/24 0526) BP: (110-127)/(67-83) 110/67 mmHg (11/24 0526) SpO2:  [95 %-98 %] 96 % (11/24 0526) Last BM Date: 08/19/13 General:  Alert, Well-developed, in NAD Heart:  Regular rate and rhythm; no murmurs Pulm:  CTAB.  No W/R/R. Abdomen:  Soft, non-distended.  BS present.  Mild right sided TTP without R/R/G. Extremities:  Without edema. Neurologic:  Alert and  oriented x4;  grossly normal neurologically. Psych:  Alert and cooperative. Normal mood and affect.  Intake/Output from previous day: 11/23 0701 - 11/24 0700 In: 840 [P.O.:840] Out: 60 [Drains:60] Intake/Output this shift: Total I/O In: -  Out: 15 [Drains:15]  Lab Results:  Recent Labs  08/19/13 1630 08/20/13 0515  WBC 13.0* 13.1*  HGB 9.2* 9.4*  HCT 28.2* 28.6*  PLT 320 262   BMET  Recent Labs  08/19/13 1630  NA 130*  K 4.3  CL 95*  CO2 24  GLUCOSE 103*  BUN 5*  CREATININE 0.76  CALCIUM 8.9   LFT  Recent Labs  08/19/13 1630  PROT 6.9  ALBUMIN 2.2*  AST 22  ALT 14  ALKPHOS 83  BILITOT 0.5   PT/INR  Recent Labs  08/20/13 0515  LABPROT 16.4*  INR 1.36   Ct Guided Abscess Drain  08/20/2013   CLINICAL DATA:  Hepatic abscess requiring percutaneous drainage.  EXAM: CT GUIDED DRAINAGE OF HEPATIC ABSCESS  ANESTHESIA/SEDATION: 6.0 Mg IV Versed 250 mcg IV Fentanyl  Total Moderate Sedation Time:  30 minutes  PROCEDURE: The procedure, risks, benefits, and alternatives were explained to the patient. Questions regarding the procedure were encouraged and  answered. The patient understands and consents to the procedure.  The right lateral abdominal wall was prepped with Betadine in a sterile fashion, and a sterile drape was applied covering the operative field. A sterile gown and sterile gloves were used for the procedure. Local anesthesia was provided with 1% Lidocaine.  CT was performed in a supine position with the right side role lobe slightly. Under CT guidance, an 18 gauge trocar needle was advanced into the right lobe of the liver. Aspiration was performed and a fluid sample sent for culture analysis. A guidewire was advanced into the collection. The tract was dilated and a 12 [place] percutaneous drain advanced. Drainage catheter position was confirmed by CT.  The drain was flushed with saline and connected to a suction bulb. It was secured at the skin with a Prolene retention suture and StatLock device.  COMPLICATIONS: None  FINDINGS: Purulent fluid was aspirated from the posterior right hepatic abscess. A liquefied pocket was targeted for drainage catheter placement. The 29 French drain is draining well after placement.  IMPRESSION: CT-guided percutaneous drainage of hepatic abscess with placement of indwelling 12 French drain. A purulent fluid sample was sent for culture analysis. Output will be followed.   Electronically Signed   By: Irish Lack M.D.   On: 08/20/2013 17:25    Assessment / Plan: -37 y.o. male with liver abscess s/p drain placement  by IR.  Is on Zosyn via PICC line currently and preliminary blood cultures/liver abscess cultures negative.  Will defer to IR regarding timing of repeat imaging/drain care.  ? Ok to repeat 11/25.  ID to follow-up with him as well.  ? Source of abscess.   -Perianal ulcerations/large fissure:  Surgery was seeing him but they have signed off, and he will follow-up with Dr. Maisie Fus as outpatient.  Will discontinue the buttcream.  Start hydrocortisone cream TID alternating with diltiazem gel 2% BID.      LOS:  3 days   ZEHR, JESSICA D.  08/22/2013, 9:55 AM  Pager number [phone]     Attending physician's note   I have taken an interval history, reviewed the chart and examined the patient. I agree with the Advanced Practitioner's note, impression and recommendations. Abscess drainage is decreasing. Await IR opinion regarding timing of repeat CT. Continue Zosyn. ID returning to follow up today. Will ask ID to consider outpatient antibiotic regimen. Begin Diltiazem for his fissure and change to HC cream instead of the barrier cream.  Judie Petit T. Russella Dar, MD [facility]

## 2013-08-22 NOTE — Progress Notes (Signed)
Quick Note:  Please inform the patient that the stomach biopsies were normal and to continue current plan of action ______

## 2013-08-23 LAB — CULTURE, ROUTINE-ABSCESS: Special Requests: NORMAL

## 2013-08-23 LAB — BASIC METABOLIC PANEL
BUN: 6 mg/dL (ref 6–23)
CO2: 26 mEq/L (ref 19–32)
Calcium: 8.8 mg/dL (ref 8.4–10.5)
Chloride: 102 mEq/L (ref 96–112)
Creatinine, Ser: 0.78 mg/dL (ref 0.50–1.35)

## 2013-08-23 LAB — CBC
HCT: 29.6 % — ABNORMAL LOW (ref 39.0–52.0)
MCH: 27.5 pg (ref 26.0–34.0)
MCHC: 31.8 g/dL (ref 30.0–36.0)
MCV: 86.5 fL (ref 78.0–100.0)
Platelets: 348 10*3/uL (ref 150–400)
RBC: 3.42 MIL/uL — ABNORMAL LOW (ref 4.22–5.81)
WBC: 6.8 10*3/uL (ref 4.0–10.5)

## 2013-08-23 MED ORDER — SODIUM CHLORIDE 0.9 % IJ SOLN
10.0000 mL | INTRAMUSCULAR | Status: DC | PRN
  Administered 2013-08-24: 10 mL

## 2013-08-23 MED ORDER — DEXTROSE 5 % IV SOLN
2.0000 g | INTRAVENOUS | Status: DC
  Administered 2013-08-23: 2 g via INTRAVENOUS
  Filled 2013-08-23 (×2): qty 2

## 2013-08-23 MED ORDER — METRONIDAZOLE 500 MG PO TABS
500.0000 mg | ORAL_TABLET | Freq: Three times a day (TID) | ORAL | Status: DC
  Administered 2013-08-23 – 2013-08-24 (×3): 500 mg via ORAL
  Filled 2013-08-23 (×5): qty 1

## 2013-08-23 MED ORDER — POLYETHYLENE GLYCOL 3350 17 G PO PACK
17.0000 g | PACK | Freq: Every day | ORAL | Status: DC
  Administered 2013-08-23 – 2013-08-24 (×2): 17 g via ORAL
  Filled 2013-08-23 (×2): qty 1

## 2013-08-23 NOTE — Progress Notes (Signed)
CARE MANAGEMENT NOTE 08/23/2013  Patient:  Nicholas Bradford, Nicholas Bradford   Account Number:  [id number]  Date Initiated:  08/20/2013  Documentation initiated by:  [facility]  Subjective/Objective Assessment:   37 year old male admitted with liver abscess.     Action/Plan:   From home. Needs IV antibiotics at d/c.   Anticipated DC Date:  08/24/2013   Anticipated DC Plan:  HOME W HOME HEALTH SERVICES     DC Planning Services  CM consult      Choice offered to / List presented to:  C-1 Patient      HH arranged  HH-1 RN      [facility] agency  [facility]   Status of service:  In process, will continue to follow Medicare Important Message given?  NA - LOS <3 / Initial given by admission  Discharge Disposition:  HOME W HOME HEALTH SERVICES  Per UR Regulation:  Reviewed for med. necessity/level of care/duration of stay

## 2013-08-23 NOTE — Progress Notes (Signed)
    [facility]    Date of Admission:  08/19/2013   Total days of antibiotics 4         Day 4 piptazo           ID: Nicholas Bradford is a 37 y.o. male with liver abscess  Active Problems:   Liver mass   Liver abscess   Anal fissure    Subjective: Afebrile. Improved oral intake  Medications:  . clonazePAM  0.5 mg Oral QID  . diltiazem   Topical BID  . hydrocortisone-pramoxine   Rectal TID  . influenza vac split quadrivalent PF  0.5 mL Intramuscular Tomorrow-1000  . pantoprazole  40 mg Oral Daily  . piperacillin-tazobactam (ZOSYN)  IV  3.375 g Intravenous Q8H  . polyethylene glycol  17 g Oral Daily    Objective: Vital signs in last 24 hours: Temp:  [97.6 F (36.4 C)-98.2 F (36.8 C)] 97.6 F (36.4 C) (11/25 0541) Pulse Rate:  [77-85] 77 (11/25 0541) Resp:  [16-18] 18 (11/25 0541) BP: (99-128)/(64-77) 124/77 mmHg (11/25 0541) SpO2:  [97 %-98 %] 98 % (11/25 0541)  Physical Exam  Constitutional: He is oriented to person, place, and time. He appears well-developed and well-nourished. No distress.  HENT:  Mouth/Throat: Oropharynx is clear and moist. No oropharyngeal exudate.  Cardiovascular: Normal rate, regular rhythm and normal heart sounds. Exam reveals no gallop and no friction rub.  No murmur heard.  Pulmonary/Chest: Effort normal and breath sounds normal. No respiratory distress. He has no wheezes.  Abdominal: Soft. Bowel sounds are normal. Mild tenderness in RUQ. Drain in place Lymphadenopathy:  He has no cervical adenopathy.  Neurological: He is alert and oriented to person, place, and time.  Skin: Skin is warm and dry. No rash noted. No erythema.  Psychiatric: He has a normal mood and affect. His behavior is normal.    Lab Results  Recent Labs  08/23/13 0502  WBC 6.8  HGB 9.4*  HCT 29.6*  NA 137  K 4.2  CL 102  CO2 26  BUN 6  CREATININE 0.78    Microbiology: 11/22 routine cx: NGTd Studies/Results: No results  found.   Assessment/Plan: 37yo M with presumed liver abscess, no growth on culture - will plan on treating with ceftriaxone 2gm IV daily plus oral metronidazole 500mg  TID  x 2-3 wks and assess repeat abd CT to see if need to extend therapy.  - we will see him back in ID clinic in 2 wks  Velva Molinari, [facility] Cell: [phone] Pager: [phone]  08/23/2013, 12:57 PM

## 2013-08-23 NOTE — Progress Notes (Signed)
Peripherally Inserted Central Catheter/Midline Placement  The IV Nurse has discussed with the patient and/or persons authorized to consent for the patient, the purpose of this procedure and the potential benefits and risks involved with this procedure.  The benefits include less needle sticks, lab draws from the catheter and patient may be discharged home with the catheter.  Risks include, but not limited to, infection, bleeding, blood clot (thrombus formation), and puncture of an artery; nerve damage and irregular heat beat.  Alternatives to this procedure were also discussed.  PICC/Midline Placement Documentation        Nicholas Bradford 08/23/2013, 11:10 AM

## 2013-08-23 NOTE — Progress Notes (Signed)
[facility] Progress Note  Subjective:  Feels better.  Eating better today.  Happy about going tomorrow.  Objective:  Vital signs in last 24 hours: Temp:  [97.6 F (36.4 C)-98.2 F (36.8 C)] 97.6 F (36.4 C) (11/25 0541) Pulse Rate:  [77-85] 77 (11/25 0541) Resp:  [16-18] 18 (11/25 0541) BP: (99-128)/(64-77) 124/77 mmHg (11/25 0541) SpO2:  [97 %-98 %] 98 % (11/25 0541) Last BM Date: 08/19/13 General:   Alert, Well-developed, in NAD Heart:  Regular rate and rhythm; no murmurs Pulm:  CTAB.  No W/R/R. Abdomen:  Soft, non-distended.  BS present.  Mild RUQ TTP without R/R/G. Extremities:  Without edema. Neurologic:  Alert and oriented x4;  grossly normal neurologically. Psych:  Alert and cooperative. Normal mood and affect.  Intake/Output from previous day: 11/24 0701 - 11/25 0700 In: 1120 [P.O.:720; I.V.:400] Out: 37 [Drains:37]  Lab Results:  Recent Labs  08/23/13 0502  WBC 6.8  HGB 9.4*  HCT 29.6*  PLT 348   BMET  Recent Labs  08/23/13 0502  NA 137  K 4.2  CL 102  CO2 26  GLUCOSE 112*  BUN 6  CREATININE 0.78  CALCIUM 8.8   Assessment / Plan: -37 y.o. male with liver abscess s/p drain placement by IR on 11/22. Is on Zosyn via PICC line currently.  IR will arrange for outpatient follow-up regarding repeat CT scan and drain management, but they do not necessarily think repeat CT scan prior to D/C is needed (they would like to wait a while longer).  We will have ID see him again today as well regarding type of antibiotics and duration of therapy.  Case management has been consulted to coordinate home care for IV antibiotics and drain care.  Plan for D/C home tomorrow, 11/26. -Perianal ulcerations/large fissure: Surgery was seeing him but they have signed off, and he will follow-up with Dr. Maisie Fus as outpatient on 12/5 at 1:45pm.  Discontinued the buttcream.  Started hydrocortisone cream TID alternating with diltiazem gel 2% BID.  Will discontinue colace  and place him on Miralax daily for constipation, to keep stools soft.    LOS: 4 days   ZEHR, JESSICA D.  08/23/2013, 10:19 AM  Pager number [phone]    Attending physician's note   I have taken an interval history, reviewed the chart and examined the patient. I agree with the Advanced Practitioner's note, impression and recommendations. He will need a repeat CT when abscess drainage decreases, hopefully in a few days. ID consult to recommend outpatient antibiotics and initial duration of therapy, awaiting their input. Plan for discharge tomorrow and follow up with Dr. Melvia Heaps.   Venita Lick. Russella Dar, MD [facility]

## 2013-08-23 NOTE — Progress Notes (Signed)
Subjective: Pt doing ok; no new c/o  Objective: Vital signs in last 24 hours: Temp:  [97.6 F (36.4 C)-98.2 F (36.8 C)] 97.6 F (36.4 C) (11/25 0541) Pulse Rate:  [77-85] 77 (11/25 0541) Resp:  [16-18] 18 (11/25 0541) BP: (99-128)/(64-77) 124/77 mmHg (11/25 0541) SpO2:  [97 %-98 %] 98 % (11/25 0541) Last BM Date: 08/19/13  Intake/Output from previous day: 11/24 0701 - 11/25 0700 In: 1120 [P.O.:720; I.V.:400] Out: 37 [Drains:37] Intake/Output this shift:    Hepatic drain intact, output 40 cc's ; drain flushed with 5 cc's sterile NS without difficulty; insertion site ok, mildly tender; cx's neg to date  Lab Results:   Recent Labs  08/23/13 0502  WBC 6.8  HGB 9.4*  HCT 29.6*  PLT 348   BMET  Recent Labs  08/23/13 0502  NA 137  K 4.2  CL 102  CO2 26  GLUCOSE 112*  BUN 6  CREATININE 0.78  CALCIUM 8.8   PT/INR No results found for this basename: LABPROT, INR,  in the last 72 hours ABG No results found for this basename: PHART, PCO2, PO2, HCO3,  in the last 72 hours  Studies/Results: No results found. Results for orders placed during the hospital encounter of 08/19/13  CULTURE, BLOOD (ROUTINE X 2)     Status: None   Collection Time    08/19/13  2:45 PM      Result Value Range Status   Specimen Description BLOOD RIGHT HAND   Final   Special Requests BOTTLES DRAWN AEROBIC AND ANAEROBIC 10CC   Final   Culture  Setup Time     Final   Value: 08/19/2013 20:18     Performed at [facility]   Culture     Final   Value:        BLOOD CULTURE RECEIVED NO GROWTH TO DATE CULTURE WILL BE HELD FOR 5 DAYS BEFORE ISSUING A FINAL NEGATIVE REPORT     Performed at [facility]   Report Status PENDING   Incomplete  CULTURE, BLOOD (ROUTINE X 2)     Status: None   Collection Time    08/19/13  3:05 PM      Result Value Range Status   Specimen Description BLOOD RIGHT ARM   Final   Special Requests BOTTLES DRAWN AEROBIC AND ANAEROBIC 10CC   Final   Culture  Setup Time     Final   Value: 08/19/2013 20:18     Performed at [facility]   Culture     Final   Value:        BLOOD CULTURE RECEIVED NO GROWTH TO DATE CULTURE WILL BE HELD FOR 5 DAYS BEFORE ISSUING A FINAL NEGATIVE REPORT     Performed at [facility]   Report Status PENDING   Incomplete  CULTURE, ROUTINE-ABSCESS     Status: None   Collection Time    08/20/13  4:21 PM      Result Value Range Status   Specimen Description LIVER   Final   Special Requests Normal   Final   Gram Stain     Final   Value: ABUNDANT WBC PRESENT,BOTH PMN AND MONONUCLEAR     NO SQUAMOUS EPITHELIAL CELLS SEEN     NO ORGANISMS SEEN     Performed at [facility]   Culture     Final   Value: NO GROWTH 3 DAYS     Performed at [facility]   Report Status  08/23/2013 FINAL   Final  ANAEROBIC CULTURE     Status: None   Collection Time    08/20/13  4:21 PM      Result Value Range Status   Specimen Description LIVER   Final   Special Requests Normal   Final   Gram Stain     Final   Value: NO WBC SEEN     RARE SQUAMOUS EPITHELIAL CELLS PRESENT     RARE GRAM POSITIVE RODS     RARE GRAM NEGATIVE RODS     RARE GRAM POSITIVE COCCI   Culture     Final   Value: NO ANAEROBES ISOLATED; CULTURE IN PROGRESS FOR 5 DAYS     Performed at [facility]   Report Status PENDING   Incomplete    Anti-infectives: Anti-infectives   Start     Dose/Rate Route Frequency Ordered Stop   08/20/13 1000  piperacillin-tazobactam (ZOSYN) IVPB 3.375 g     3.375 g 12.5 mL/hr over 240 Minutes Intravenous Every 8 hours 08/19/13 1615     08/20/13 0600  piperacillin-tazobactam (ZOSYN) IVPB 3.375 g  Status:  Discontinued     3.375 g 100 mL/hr over 30 Minutes Intravenous 3 times per day 08/19/13 1557 08/19/13 1614   08/19/13 1800  piperacillin-tazobactam (ZOSYN) IVPB 3.375 g  Status:  Discontinued     3.375 g 12.5 mL/hr over 240 Minutes Intravenous 4 times per day 08/19/13 1523 08/19/13  1530   08/19/13 1600  piperacillin-tazobactam (ZOSYN) IVPB 3.375 g  Status:  Discontinued     3.375 g 12.5 mL/hr over 240 Minutes Intravenous Every 8 hours 08/19/13 1531 08/19/13 1556      Assessment/Plan: s/p rt hepatic abscess drainage 11/22; f/u cytology result on hepatic fluid; cont current tx; if d/c home anticipated for Thanksgiving can check f/u CT (with IV contrast) on 11/26 but output still sig  LOS: 4 days    ALLRED,D [facility] 08/23/2013

## 2013-08-24 ENCOUNTER — Encounter: Payer: Self-pay | Admitting: Family Medicine

## 2013-08-24 MED ORDER — HEPARIN SOD (PORK) LOCK FLUSH 100 UNIT/ML IV SOLN
250.0000 [IU] | INTRAVENOUS | Status: AC | PRN
  Administered 2013-08-24: 250 [IU]

## 2013-08-24 MED ORDER — HYDROCORTISONE ACE-PRAMOXINE 2.5-1 % RE CREA: TOPICAL_CREAM | Freq: Three times a day (TID) | RECTAL | Status: DC

## 2013-08-24 MED ORDER — HYDROCODONE-ACETAMINOPHEN 5-325 MG PO TABS: 1.0000 | ORAL_TABLET | Freq: Four times a day (QID) | ORAL | Status: DC | PRN

## 2013-08-24 MED ORDER — DILTIAZEM GEL 2 %: 1.0000 "application " | Freq: Two times a day (BID) | CUTANEOUS | Status: DC

## 2013-08-24 MED ORDER — POLYETHYLENE GLYCOL 3350 17 G PO PACK: 17.0000 g | PACK | Freq: Every day | ORAL | Status: DC

## 2013-08-24 MED ORDER — NORMAL SALINE FLUSH 0.9 % IV SOLN: 10.0000 mL | Freq: Two times a day (BID) | INTRAVENOUS | Status: DC

## 2013-08-24 MED ORDER — METRONIDAZOLE 500 MG PO TABS: 500.0000 mg | ORAL_TABLET | Freq: Three times a day (TID) | ORAL | Status: DC

## 2013-08-24 MED ORDER — PSYLLIUM 95 % PO PACK: 1.0000 | PACK | Freq: Every day | ORAL | Status: DC | PRN

## 2013-08-24 MED ORDER — DEXTROSE 5 % IV SOLN: 2.0000 g | INTRAVENOUS | Status: DC

## 2013-08-24 NOTE — Discharge Summary (Signed)
[facility] Discharge Summary  Name: Nicholas Bradford MRN: [id number] DOB: Jun 08, 1976 37 y.o. PCP:  Hannah Beat, MD  Date of Admission: 08/19/2013 11:19 AM Date of Discharge: 08/24/2013 Primary Gastroenterologist: Melvia Heaps, MD Discharging Physician: Claudette Head, MD  Discharge Diagnosis: 1. Chronic nausea and vomiting. Unclear etiology. Improved 2. Large liver abscess, s/p CT guided drainage and percutaneous drain placement by IR.  3. Perianal ulcerations / hemorrhoid / ? fissure. Conservative management 4. Gastritis (endoscopically). Biopsies normal.   Consultations:  1. General surgery. 2. Interventional Radiology 3. Infectious Disease  Procedures Performed:  Ct Guided Abscess Drain  08/20/2013   CLINICAL DATA:  Hepatic abscess requiring percutaneous drainage.  EXAM: CT GUIDED DRAINAGE OF HEPATIC ABSCESS  ANESTHESIA/SEDATION: 6.0 Mg IV Versed 250 mcg IV Fentanyl  Total Moderate Sedation Time:  30 minutes  PROCEDURE: The procedure, risks, benefits, and alternatives were explained to the patient. Questions regarding the procedure were encouraged and answered. The patient understands and consents to the procedure.  The right lateral abdominal wall was prepped with Betadine in a sterile fashion, and a sterile drape was applied covering the operative field. A sterile gown and sterile gloves were used for the procedure. Local anesthesia was provided with 1% Lidocaine.  CT was performed in a supine position with the right side role lobe slightly. Under CT guidance, an 18 gauge trocar needle was advanced into the right lobe of the liver. Aspiration was performed and a fluid sample sent for culture analysis. A guidewire was advanced into the collection. The tract was dilated and a 12 [place] percutaneous drain advanced. Drainage catheter position was confirmed by CT.  The drain was flushed with saline and connected to a suction bulb. It was secured at the skin with a Prolene  retention suture and StatLock device.  COMPLICATIONS: None  FINDINGS: Purulent fluid was aspirated from the posterior right hepatic abscess. A liquefied pocket was targeted for drainage catheter placement. The 79 French drain is draining well after placement.  IMPRESSION: CT-guided percutaneous drainage of hepatic abscess with placement of indwelling 12 French drain. A purulent fluid sample was sent for culture analysis. Output will be followed.   Electronically Signed   By: Irish Lack M.D.   On: 08/20/2013 17:25   Ct Abdomen Pelvis W Contrast  08/19/2013   CLINICAL DATA:  Abdominal pain, nausea, vomiting  EXAM: CT ABDOMEN AND PELVIS WITH CONTRAST  TECHNIQUE: Multidetector CT imaging of the abdomen and pelvis was performed using the standard protocol following bolus administration of intravenous contrast.  CONTRAST:  OMNIPAQUE IOHEXOL 300 MG/ML  SOLN  COMPARISON:  08/01/2013 acute abdominal series  FINDINGS: Clear lung bases. Trace dependent right pleural effusion. Normal heart size. No hiatal hernia.  Abdomen: Throughout the posterior aspect of the right liver, there is a complex septated enhancing low-attenuation large area measuring 9.4 x 11.3 x 9 cm. This is most compatible with a complex hepatic abscess, less likely considerations include complex biloma, hematoma, or necrotic neoplastic process.  No biliary dilatation.  Hepatic and portal veins are patent.  Collapsed gallbladder, biliary system, pancreas, spleen, adrenal glands, and kidneys are within normal limits for age and demonstrate no acute process.  Negative for bowel obstruction, dilatation, ileus, or free air. Left descending colon and sigmoid diverticulosis evident.  No abdominal free fluid, adenopathy, or hemorrhage.  Negative for aneurysm or acute vascular process.  Pelvis: Sigmoid diverticulosis again evident. Urinary bladder under distended. No pelvic fluid collection, hemorrhage, additional abscess, adenopathy, inguinal  abnormality, or hernia.  No acute osseous finding  IMPRESSION: 11 cm posterior right hepatic complex enhancing low-attenuation hepatic abnormality, consistent with an hepatic abscess.  This would be amenable to image guided aspiration and drainage.  Critical Value/emergent results were called by telephone at the time of interpretation on 08/19/2013 at 9:46 AM to Dr.ROBERT [facility] , who verbally acknowledged these results.   Electronically Signed   By: Ruel Favors M.D.   On: 08/19/2013 09:48   Dg Abd Acute W/chest  08/01/2013   ADDENDUM REPORT: 08/01/2013 16:06  ADDENDUM: I inadvertently stated that the round calcification in the midline of the pelvis might be a calcified uterine fibroid. That is, of course, not possible in this male patient, and is therefore most likely a phlebolith or a calcified epiploic appendage adjacent to the sigmoid colon. This was discussed by telephone with Dr. Renae Gloss of the emergency department.   Electronically Signed   By: Hulan Saas M.D.   On: 08/01/2013 16:06   08/01/2013   CLINICAL DATA:  Initial encounter for low abdominal pain, nausea/vomiting, and diarrhea. Current history of irritable bowel syndrome.  EXAM: ACUTE ABDOMEN SERIES (ABDOMEN 2 VIEW & CHEST 1 VIEW)  COMPARISON:  None.  FINDINGS: Bowel gas pattern unremarkable without evidence of obstruction or significant ileus. No evidence of free air or significant air-fluid levels on the erect image. Air-fluid levels in the colon consistent liquid stool in this patient with given history of diarrhea. Round calcification in the midline of the pelvis may be a large phlebolith or a degenerated uterine fibroid. No abnormal calcifications elsewhere, specifically, no visible opaque urinary tract calculi. Regional skeleton intact.  Cardiomediastinal silhouette unremarkable. Lungs clear. Bronchovascular markings normal. Pulmonary vascularity normal. No visible pleural effusions. No pneumothorax. Mild elevation of the right  hemidiaphragm.  IMPRESSION: No acute abdominal or pulmonary abnormality.  Electronically Signed: By: Hulan Saas M.D. On: 08/01/2013 15:57    GI Procedures:  Flexible sigmoidoscopy 08/16/13 COLON FINDINGS: In the sigmoid and descending colon there was  extensive diverticular disease characterized by multiple  diverticula, muscular hypertrophy and luminal narrowing.  2. In the sigmoid and descending colon there was extensive  diverticular disease characterized by multiple diverticula,  muscular hypertrophy and luminal narrowing.  3. The colon mucosa was otherwise normal.  Retroflexed views revealed internal hemorrhoid. The scope was  then withdrawn from the patient and the procedure terminated.  EGD 08/16/13 In the gastric body and antrum there is moderate diffuse erythema.  Biopsies were taken.  There are multiple erosions at the GE junction and there is  erythematous mucosa. The remainder of the upper endoscopy exam  was otherwise normal. Retroflexed views revealed no abnormalities.  The scope was then withdrawn from the patient and the procedure  completed.   History/Physical Exam:  See Admission H&P  Admission HPI: Per Dr. Arlyce Dice 37 year old white male with history of schizoaffective disorder, IBS and sleep apnea referred for evaluation of chronic nausea, vomiting and rectal pain. Since his early 33s he's had episodic nausea with vomiting. Symptoms clearly are worsening. He now has chronic nausea and may vomit almost daily or every other day. He claims he has lost 50 pounds over the past 6 months. He is nauseated throughout the day although it may be worsened postprandially. He denies abdominal pain, per se. He has burning chest discomfort that is relieved with Pepcid. He denies dysphagia. He denies substance abuse or use. Bowels are erratic characterized by alternating loose stools and constipation. He's had several ER visits for painful hemorrhoids.  Exam is remarkable for a deep  fissure emanating from the anus down to the level of the muscularis, and external skin tags and hemorrhoids. The patient underwent upper endoscopy on November 18 that demonstrated mild gastritis. Sigmoidoscopy was done because of a concern for possible perianal Crohn's disease. Diverticulosis, internal and external hemorrhoids were seen. CT scan was ordered to rule out Crohn's disease or partial bowel obstruction. This demonstrated a multiloculated mass in the right lobe of the liver. Patient is admitted for further evaluation and therapy.  Hospital Course by problem list:  1. Chronic nausea and vomiting. Etiology not clear but symptoms improved. Endoscopically patient had diffuse gastritis but no histologic evidence of inflammation or other abnormalities.   2. Liver abscess. Patient was admitted to a medical floor at [facility] on 08/19/13. The following day he underwent CT guided drainage of a large multiloculated liver abscess. The purulent drainage was sent for culture, percutaneous drain with suction bulb was placed. Following that, a PICC line was placed, Zosyn started. Culture showed no growth. Cytology c/w acute inflammation, negative for malignancy. Under direction of Infectious Disease patient was discharged home on Rocephin and oral Flagyl x 14 days. Home Health met with patient, instructed him on IV antibiotic administration. PICC line and percutaneous drain will remain in place until patient sees Infectious Disease in two weeks. Patient was instructed how to flush percutaneous drain with normal saline twice daily    3. Large anal ulcerations / fissure. Patient seen by surgery who wasn't sure this was a clear cut fissure. Patient started on fiber, diltiazem gel and analpram. Surgery to see him outpatient early next month.   4. Gastritis (endoscopically) but biopsies normal.  Discharge Vitals:  BP 118/67  Pulse 81  Temp(Src) 97.7 F (36.5 C) (Oral)  Resp 18  Ht 5\' 11"  (1.803 m)  Wt 248  lb 7.3 oz (112.7 kg)  BMI 34.67 kg/m2  SpO2 96%  Physical Exam General: Pleasant white male in NAD Cardiac: RRR Pulmonary: Lungs clear to ausculation bilaterally Abdominal: soft, nondistended, nontender. Perc drain with scant amount of milky fluid Extremity: no edema Neuro: Alert and oriented Psych: pleasant, cooperative  Discharge Labs: No results found for this or any previous visit (from the past 24 hour(s)).  Disposition and follow-up:   Nicholas Bradford was discharged from [facility] in stable condition.    Follow-up Appointments: Discharge Orders   Future Appointments Provider Department Dept Phone   09/02/2013 2:10 PM Romie Levee, MD [facility], [place] [phone]   11/11/2013 11:15 AM Barbaraann Share, MD  Pulmonary Care [phone]   Future Orders Complete By Expires   Call MD for:  redness, tenderness, or signs of infection (pain, swelling, bleeding, redness, odor or green/yellow discharge around incision site)  As directed    Call MD for:  redness, tenderness, or signs of infection (pain, swelling, bleeding, redness, odor or green/yellow discharge around incision site)  As directed    Call MD for:  severe or increased pain, loss or decreased feeling  in affected limb(s)  As directed    Call MD for:  severe or increased pain, loss or decreased feeling  in affected limb(s)  As directed    Call MD for:  temperature >100.5  As directed    Call MD for:  temperature >100.5  As directed    Discharge instructions  As directed    Comments:     You have an office follow up with surgery already scheduled. You  will need to follow up in Infectious Disease clinic in two weeks. Please call their office for an appointment. Home Health Services have been arranged for IV antibiotics.   Discharge instructions  As directed    Scheduling Instructions:     Flush biliary drain 1-2 times daily with 5cc normal saline   Driving Restrictions  As directed    Comments:      Do not drive while taking Hydrocodone   Lifting restrictions  As directed    Comments:     Do not lift more than 10 pounds while drain is in place   Resume previous diet  As directed       Discharge Medications:   Medication List         clonazePAM 0.5 MG tablet  Commonly known as:  KLONOPIN  Takes one tablet by mouth four times a day     dextrose 5 % SOLN 50 mL with cefTRIAXone 2 G SOLR 2 g  Inject 2 g into the vein daily.     diltiazem 2 % Gel  Apply 1 application topically 2 (two) times daily.     esomeprazole 20 MG capsule  Commonly known as:  NEXIUM  Take 20 mg by mouth daily at 12 noon.     HYDROcodone-acetaminophen 5-325 MG per tablet  Commonly known as:  NORCO/VICODIN  Take 1 tablet by mouth every 6 (six) hours as needed for moderate pain.     hydrocortisone-pramoxine 2.5-1 % rectal cream  Commonly known as:  ANALPRAM-HC  Place rectally 3 (three) times daily.     metroNIDAZOLE 500 MG tablet  Commonly known as:  FLAGYL  Take 1 tablet (500 mg total) by mouth every 8 (eight) hours.     multivitamin with minerals Tabs tablet  Take 1 tablet by mouth daily.     Normal Saline Flush 0.9 % Soln  Inject 10 mLs into the vein 2 (two) times daily.  Start taking on:  08/25/2013     polyethylene glycol packet  Commonly known as:  MIRALAX / GLYCOLAX  Take 17 g by mouth daily.     psyllium 95 % Pack  Commonly known as:  HYDROCIL/METAMUCIL  Take 1 packet by mouth daily as needed for mild constipation.        Signed: Willette Cluster 08/24/2013, 12:01 PM

## 2013-08-24 NOTE — Progress Notes (Signed)
[facility]  Patient Status:   New pt for [facility] this admission  [facility] is providing the following services:   [facility] will be providing HHRN and [facility] services for home IV ABX.  [facility] hospital team will support in hospital teaching for transition to home when deemed appropriate by MD team.  If patient discharges after hours, please call [phone].   Sedalia Muta 08/24/2013, 8:53 AM

## 2013-08-24 NOTE — Discharge Summary (Signed)
Attending physician's note   I have taken an interval history, reviewed the chart and examined the patient. I agree with the Advanced Practitioner's note, impression and plans.   Venita Lick. Russella Dar, MD [facility]

## 2013-08-24 NOTE — Progress Notes (Signed)
  Subjective: Hepatic abscess drain placed 11/22 Feels better Output minimal Plan for dc home with drain today  Objective: Vital signs in last 24 hours: Temp:  [97.7 F (36.5 C)-98.3 F (36.8 C)] 97.7 F (36.5 C) (11/26 0541) Pulse Rate:  [81-87] 81 (11/26 0541) Resp:  [16-18] 18 (11/26 0541) BP: (101-118)/(63-80) 118/67 mmHg (11/26 0541) SpO2:  [96 %-97 %] 96 % (11/26 0541) Last BM Date: 08/23/13  Intake/Output from previous day: 11/25 0701 - 11/26 0700 In: 2141.7 [P.O.:120; I.V.:1971.7; IV Piggyback:50] Out: 15 [Drains:15] Intake/Output this shift: Total I/O In: 360 [P.O.:360] Out: -   PE:  Afeb; wbc 6.8 Output milky yellow- 15 cc yesterday 15 cc in JP now Site clean and dry; NT Cx no growth  Lab Results:   Recent Labs  08/23/13 0502  WBC 6.8  HGB 9.4*  HCT 29.6*  PLT 348   BMET  Recent Labs  08/23/13 0502  NA 137  K 4.2  CL 102  CO2 26  GLUCOSE 112*  BUN 6  CREATININE 0.78  CALCIUM 8.8   PT/INR No results found for this basename: LABPROT, INR,  in the last 72 hours ABG No results found for this basename: PHART, PCO2, PO2, HCO3,  in the last 72 hours  Studies/Results: No results found.  Anti-infectives: Anti-infectives   Start     Dose/Rate Route Frequency Ordered Stop   08/23/13 2200  metroNIDAZOLE (FLAGYL) tablet 500 mg     500 mg Oral 3 times per day 08/23/13 1856     08/23/13 1900  cefTRIAXone (ROCEPHIN) 2 g in dextrose 5 % 50 mL IVPB     2 g 100 mL/hr over 30 Minutes Intravenous Every 24 hours 08/23/13 1856     08/20/13 1000  piperacillin-tazobactam (ZOSYN) IVPB 3.375 g  Status:  Discontinued     3.375 g 12.5 mL/hr over 240 Minutes Intravenous Every 8 hours 08/19/13 1615 08/23/13 1856   08/20/13 0600  piperacillin-tazobactam (ZOSYN) IVPB 3.375 g  Status:  Discontinued     3.375 g 100 mL/hr over 30 Minutes Intravenous 3 times per day 08/19/13 1557 08/19/13 1614   08/19/13 1800  piperacillin-tazobactam (ZOSYN) IVPB 3.375 g  Status:   Discontinued     3.375 g 12.5 mL/hr over 240 Minutes Intravenous 4 times per day 08/19/13 1523 08/19/13 1530   08/19/13 1600  piperacillin-tazobactam (ZOSYN) IVPB 3.375 g  Status:  Discontinued     3.375 g 12.5 mL/hr over 240 Minutes Intravenous Every 8 hours 08/19/13 1531 08/19/13 1556      Assessment/Plan: s/p * No surgery found *  Hepatic abscess drain intact Output diminishing Wbc wnl No growth cx Plan dc home today with drain per Dr Arlyce Dice Will need re CT when output less than 10 cc/day   LOS: 5 days    Jt Brabec A 08/24/2013

## 2013-08-25 LAB — CULTURE, BLOOD (ROUTINE X 2): Culture: NO GROWTH

## 2013-08-25 LAB — ANAEROBIC CULTURE: Gram Stain: NONE SEEN

## 2013-08-30 ENCOUNTER — Telehealth: Payer: Self-pay | Admitting: Gastroenterology

## 2013-08-30 ENCOUNTER — Telehealth: Payer: Self-pay | Admitting: *Deleted

## 2013-08-30 MED ORDER — HYDROCODONE-ACETAMINOPHEN 5-325 MG PO TABS: 1.0000 | ORAL_TABLET | Freq: Four times a day (QID) | ORAL | Status: DC | PRN

## 2013-08-30 NOTE — Telephone Encounter (Signed)
Med printed and pt to pick up

## 2013-08-30 NOTE — Telephone Encounter (Signed)
Okay to refill Vicodin

## 2013-08-30 NOTE — Telephone Encounter (Signed)
Approved for 3 years for Nexium

## 2013-08-31 ENCOUNTER — Telehealth: Payer: Self-pay

## 2013-08-31 NOTE — Telephone Encounter (Signed)
Message copied by Chrystie Nose on Wed Aug 31, 2013 10:12 AM ------      Message from: Claudette Head T      Created: Wed Aug 31, 2013  9:30 AM       Bonita Quin, This is a pt of RK who was recently discharged from [facility] with a liver abscess with a drainage catheter in place and receiving home IV antibiotics. Please see the discharge summary. He needs office follow up within the week. MS ------

## 2013-08-31 NOTE — Telephone Encounter (Signed)
Left message for pt to call back  °

## 2013-09-01 NOTE — Telephone Encounter (Signed)
Spoke with pt and he has an appt with [facility] tomorrow. Scheduled him to see Amy Esterwood PA 09/05/13@1 :30pm. Pt aware of appt date and time.

## 2013-09-02 ENCOUNTER — Ambulatory Visit (INDEPENDENT_AMBULATORY_CARE_PROVIDER_SITE_OTHER): Payer: No Typology Code available for payment source | Admitting: General Surgery

## 2013-09-02 ENCOUNTER — Encounter (INDEPENDENT_AMBULATORY_CARE_PROVIDER_SITE_OTHER): Payer: Self-pay | Admitting: General Surgery

## 2013-09-02 VITALS — BP 110/70 | HR 60 | Temp 97.4°F | Resp 14 | Ht 71.0 in | Wt 250.4 lb

## 2013-09-02 DIAGNOSIS — K600 Acute anal fissure: Secondary | ICD-10-CM

## 2013-09-02 DIAGNOSIS — K602 Anal fissure, unspecified: Secondary | ICD-10-CM

## 2013-09-02 MED ORDER — HYDROCORTISONE ACE-PRAMOXINE 2.5-1 % RE CREA: TOPICAL_CREAM | Freq: Three times a day (TID) | RECTAL | Status: DC | PRN

## 2013-09-02 NOTE — Patient Instructions (Signed)
Stop the diltiazem cream.  Use hydrocortisone cream as needed only.

## 2013-09-02 NOTE — Progress Notes (Signed)
Chief Complaint  Patient presents with  . New Evaluation    eval anal fissure    HISTORY: Nicholas Bradford is a 37 y.o. male who presents to the office with anal pain. This started after a bout of severe diarrhea.  He had been having chronic nausea as well.  He was admitted to the hospital and a large liver abscess was identified.  This was drained percutaneously.  He is currently on home on IV antibiotics.  His nausea and diarrhea are now getting better.  His severe anal pain is gone.  He denies any anal symptoms at this time.  He has been using diltiazem and hydrocortisone creams anally.  His BM's are soft.      Past Medical History  Diagnosis Date  . GERD (gastroesophageal reflux disease)   . Schizoaffective disorder 07/01/2013  . Irritable bowel syndrome (IBS)   . Morbid obesity 07/01/2013  . Obstructive sleep apnea 07/01/2013      Past Surgical History  Procedure Laterality Date  . Appendectomy  1990  . Liver surgery      has a pick line, tube for liver abscess        Current Outpatient Prescriptions  Medication Sig Dispense Refill  . clonazePAM (KLONOPIN) 0.5 MG tablet Takes one tablet by mouth four times a day      . dextrose 5 % SOLN 50 mL with cefTRIAXone 2 G SOLR 2 g Inject 2 g into the vein daily.  14 Bottle  0  . diltiazem 2 % GEL Apply 1 application topically 2 (two) times daily.  30 g  1  . esomeprazole (NEXIUM) 20 MG capsule Take 20 mg by mouth daily at 12 noon.      Marland Kitchen HYDROcodone-acetaminophen (NORCO/VICODIN) 5-325 MG per tablet Take 1 tablet by mouth every 6 (six) hours as needed for moderate pain.  30 tablet  0  . hydrocortisone-pramoxine (ANALPRAM-HC) 2.5-1 % rectal cream Place rectally 3 (three) times daily.  30 g  0  . metroNIDAZOLE (FLAGYL) 500 MG tablet Take 1 tablet (500 mg total) by mouth every 8 (eight) hours.  42 tablet  0  . Multiple Vitamin (MULTIVITAMIN WITH MINERALS) TABS tablet Take 1 tablet by mouth daily.      . polyethylene glycol (MIRALAX / GLYCOLAX)  packet Take 17 g by mouth daily.  14 each  0   No current facility-administered medications for this visit.      No Known Allergies    Family History  Problem Relation Age of Onset  . Breast cancer Mother   . Cancer Mother     breast  . Alcohol abuse Father   . Heart disease Paternal Grandfather     History   Social History  . Marital Status: Married    Spouse Name: N/A    Number of Children: 0  . Years of Education: N/A   Occupational History  . unemployeed [employer]   Social History Main Topics  . Smoking status: Former Smoker -- 0.25 packs/day for .3 years    Types: Cigarettes    Quit date: 09/30/1995  . Smokeless tobacco: Never Used     Comment: Pt only smoked x 3-4 months total. 2nd hand smoke exposure x 30+years.  . Alcohol Use: 0.5 oz/week    1 drink(s) per week  . Drug Use: No     Comment: sometimes / infrequent (last smoked= 79mo ago)  . Sexual Activity: Yes    Partners: Female   Other Topics Concern  .  None   Social History Narrative   Now unemployed    Married      REVIEW OF SYSTEMS - PERTINENT POSITIVES ONLY: Review of Systems - General ROS: negative for - chills, fever or weight loss Hematological and Lymphatic ROS: negative for - bleeding problems, blood clots or bruising Respiratory ROS: no cough, shortness of breath, or wheezing Cardiovascular ROS: no chest pain or dyspnea on exertion Gastrointestinal ROS: positive for - abdominal pain, change in stools, diarrhea and nausea/vomiting (getting better) negative for - constipation Genito-Urinary ROS: no dysuria, trouble voiding, or hematuria  EXAM: Filed Vitals:   09/02/13 1414  BP: 110/70  Pulse: 60  Temp: 97.4 F (36.3 C)  Resp: 14    General appearance: alert and cooperative Resp: clear to auscultation bilaterally Cardio: regular rate and rhythm GI: normal findings: soft, non-tender  Anal Exam: Healing radial fissures, c/w severe diarrhea and acute anal fissures.  Moderate, non-tender thrombosed hemorrhoid    ASSESSMENT AND PLAN: Nicholas Bradford is a 37 y.o. M who is s/p hospitalization for a liver abscess. It appears he developed acute anal fissures due to severe diarrhea.  These have healed appropriately now that the diarrhea is better.  Recommended that he stop the diltiazem and use steroid cream prn only.  Follow up with me as needed.   Vanita Panda, MD Colon and Rectal Surgery / General Surgery [facility]      Visit Diagnoses: 1. Acute anal fissure     Primary Care Physician: Hannah Beat, MD

## 2013-09-03 ENCOUNTER — Encounter (HOSPITAL_COMMUNITY): Payer: Self-pay | Admitting: Emergency Medicine

## 2013-09-03 ENCOUNTER — Emergency Department (HOSPITAL_COMMUNITY)
Admission: EM | Admit: 2013-09-03 | Discharge: 2013-09-03 | Disposition: A | Discharge status: Home / Self Care | Payer: No Typology Code available for payment source | Attending: Emergency Medicine | Admitting: Emergency Medicine

## 2013-09-03 DIAGNOSIS — Z8669 Personal history of other diseases of the nervous system and sense organs: Secondary | ICD-10-CM | POA: Insufficient documentation

## 2013-09-03 DIAGNOSIS — K219 Gastro-esophageal reflux disease without esophagitis: Secondary | ICD-10-CM | POA: Insufficient documentation

## 2013-09-03 DIAGNOSIS — Z79899 Other long term (current) drug therapy: Secondary | ICD-10-CM | POA: Insufficient documentation

## 2013-09-03 DIAGNOSIS — F259 Schizoaffective disorder, unspecified: Secondary | ICD-10-CM | POA: Insufficient documentation

## 2013-09-03 DIAGNOSIS — R61 Generalized hyperhidrosis: Secondary | ICD-10-CM | POA: Insufficient documentation

## 2013-09-03 DIAGNOSIS — R112 Nausea with vomiting, unspecified: Secondary | ICD-10-CM | POA: Insufficient documentation

## 2013-09-03 DIAGNOSIS — R509 Fever, unspecified: Secondary | ICD-10-CM | POA: Insufficient documentation

## 2013-09-03 DIAGNOSIS — T82598A Other mechanical complication of other cardiac and vascular devices and implants, initial encounter: Secondary | ICD-10-CM | POA: Insufficient documentation

## 2013-09-03 DIAGNOSIS — Z87891 Personal history of nicotine dependence: Secondary | ICD-10-CM | POA: Insufficient documentation

## 2013-09-03 DIAGNOSIS — T8189XA Other complications of procedures, not elsewhere classified, initial encounter: Secondary | ICD-10-CM

## 2013-09-03 DIAGNOSIS — Y849 Medical procedure, unspecified as the cause of abnormal reaction of the patient, or of later complication, without mention of misadventure at the time of the procedure: Secondary | ICD-10-CM | POA: Insufficient documentation

## 2013-09-03 HISTORY — DX: Abscess of liver: K75.0

## 2013-09-03 LAB — CBC WITH DIFFERENTIAL/PLATELET
Basophils Relative: 1 % (ref 0–1)
Eosinophils Absolute: 0.6 10*3/uL (ref 0.0–0.7)
Hemoglobin: 11.9 g/dL — ABNORMAL LOW (ref 13.0–17.0)
Lymphocytes Relative: 51 % — ABNORMAL HIGH (ref 12–46)
Lymphs Abs: 3.6 10*3/uL (ref 0.7–4.0)
MCH: 27.5 pg (ref 26.0–34.0)
Monocytes Absolute: 0.7 10*3/uL (ref 0.1–1.0)
Monocytes Relative: 10 % (ref 3–12)
Neutro Abs: 2.1 10*3/uL (ref 1.7–7.7)
Neutrophils Relative %: 29 % — ABNORMAL LOW (ref 43–77)
RBC: 4.32 MIL/uL (ref 4.22–5.81)

## 2013-09-03 NOTE — ED Provider Notes (Signed)
CSN: [id number]     Arrival date & time 09/03/13  1622 History   First MD Initiated Contact with Patient 09/03/13 1700     Chief Complaint  Patient presents with  . drain leaking    (Consider location/radiation/quality/duration/timing/severity/associated sxs/prior Treatment) HPI Comments: The patient is a 37 year old male with a medical history of a Large liver abscess, s/p CT guided drainage and percutaneous drain placement by IR presenting to the ED with surgical drain malfunctioning.  He states that today he was attempting to flush the drain with saline when the saline leaked out from the insertion site.  He reports he had a small amount of purulent discharge on the dressing.  He reports associated nausea and one episode of vomiting yesterday.  He states he continues to have night sweats and chills.   The history is provided by the patient and medical records. No language interpreter was used.    Past Medical History  Diagnosis Date  . GERD (gastroesophageal reflux disease)   . Schizoaffective disorder 07/01/2013  . Irritable bowel syndrome (IBS)   . Morbid obesity 07/01/2013  . Obstructive sleep apnea 07/01/2013  . Liver abscess    Past Surgical History  Procedure Laterality Date  . Appendectomy  1990  . Liver surgery      has a pick line, tube for liver abscess   Family History  Problem Relation Age of Onset  . Breast cancer Mother   . Cancer Mother     breast  . Alcohol abuse Father   . Heart disease Paternal Grandfather    History  Substance Use Topics  . Smoking status: Former Smoker -- 0.25 packs/day for .3 years    Types: Cigarettes    Quit date: 09/30/1995  . Smokeless tobacco: Never Used     Comment: Pt only smoked x 3-4 months total. 2nd hand smoke exposure x 30+years.  . Alcohol Use: No    Review of Systems  Constitutional: Positive for fever and chills.  Gastrointestinal: Negative for nausea, vomiting, abdominal pain, diarrhea, constipation, blood in stool  and abdominal distention.  Genitourinary: Negative for dysuria, hematuria and flank pain.  All other systems reviewed and are negative.    Allergies  Review of patient's allergies indicates no known allergies.  Home Medications   Current Outpatient Rx  Name  Route  Sig  Dispense  Refill  . clonazePAM (KLONOPIN) 0.5 MG tablet   Oral   Take 0.5 mg by mouth 4 (four) times daily.          Marland Kitchen dextrose 5 % SOLN 50 mL with cefTRIAXone 2 G SOLR 2 g   Intravenous   Inject 2 g into the vein daily. 2 weeks starting 11/26         . esomeprazole (NEXIUM) 20 MG capsule   Oral   Take 20 mg by mouth daily at 12 noon.         Marland Kitchen HYDROcodone-acetaminophen (NORCO/VICODIN) 5-325 MG per tablet   Oral   Take 1 tablet by mouth every 6 (six) hours as needed for moderate pain.         . hydrocortisone-pramoxine (ANALPRAM-HC) 2.5-1 % rectal cream   Rectal   Place rectally 3 (three) times daily as needed for hemorrhoids or itching.   30 g   0   . metroNIDAZOLE (FLAGYL) 500 MG tablet   Oral   Take 500 mg by mouth every 8 (eight) hours. Started 11/26 for 21 days         .  Multiple Vitamin (MULTIVITAMIN WITH MINERALS) TABS tablet   Oral   Take 1 tablet by mouth daily.         . polyethylene glycol (MIRALAX / GLYCOLAX) packet   Oral   Take 17 g by mouth daily.   14 each   0    BP 110/72  Pulse 113  Temp(Src) 98.3 F (36.8 C) (Oral)  SpO2 96% Physical Exam  Nursing note and vitals reviewed. Constitutional: He is oriented to person, place, and time. He appears well-developed and well-nourished. No distress.  HENT:  Head: Normocephalic and atraumatic.  Eyes: EOM are normal. Pupils are equal, round, and reactive to light. No scleral icterus.  Neck: Neck supple.  Cardiovascular: Normal rate, regular rhythm and normal heart sounds.   No murmur heard. Pulmonary/Chest: Effort normal and breath sounds normal. He has no wheezes.  Abdominal: Soft. Bowel sounds are normal. There is no  tenderness. There is no rebound and no guarding.  Single surgical drain in place in RUQ.  Minimal purulent discharge around the drain line noted.  Clear liquid and small white solid material in line and suction bulb.  Musculoskeletal: Normal range of motion. He exhibits no edema.  Neurological: He is alert and oriented to person, place, and time.  Skin: Skin is warm and dry. No rash noted.  Psychiatric: He has a normal mood and affect.    ED Course  Procedures (including critical care time) Labs Review Labs Reviewed  CBC WITH DIFFERENTIAL - Abnormal; Notable for the following:    Hemoglobin 11.9 (*)    HCT 36.7 (*)    RDW 15.9 (*)    Neutrophils Relative % 29 (*)    Lymphocytes Relative 51 (*)    Eosinophils Relative 9 (*)    All other components within normal limits   Imaging Review No results found.  EKG Interpretation   None       MDM   1. Draining postoperative wound, initial encounter    Patient with a drain placed by IR (08/20/2013).  EMR reveals a large liver abscess and pt was placed on out-pt antibiotics and followed up with Dr. Maisie Fus on 09/02/2013. Currently afebrile. 1839-Discussed patient condition with Dr. Marcelo Baldy who suggests checking WBC and if elevated consider CT. WBC 7.2. Discussed patient history and lab values with Dr. Criss Alvine who agrees the patient can be evaluated as an out-patient.  Discussed lab results and treatment plan with the patient.  He reports understanding and no other concerns at this time.  Patient is stable for discharge at this time.      Clabe Seal, PA-C 09/07/13 [phone]

## 2013-09-03 NOTE — ED Notes (Signed)
Drainage bag emptied. Drain flushed, fluid leaking around insertion site.

## 2013-09-03 NOTE — ED Notes (Signed)
Pt has tube to drain liver abscess, was flushing tube this morning and saline came out around insertion site

## 2013-09-04 ENCOUNTER — Telehealth: Payer: Self-pay | Admitting: Internal Medicine

## 2013-09-04 ENCOUNTER — Other Ambulatory Visit: Payer: Self-pay | Admitting: Interventional Radiology

## 2013-09-04 DIAGNOSIS — K750 Abscess of liver: Secondary | ICD-10-CM

## 2013-09-04 NOTE — Telephone Encounter (Signed)
Pt calling c/o N&V- he is on Flagyl 500 mg po tid for liver abscess and an IV antibiotic via PICC line. He is to see A.Esterwood tomorrow. I have asked him to hold Flagyl till he sees Ms Monica Becton. I think his nausea is medication related.

## 2013-09-05 ENCOUNTER — Other Ambulatory Visit: Payer: Self-pay | Admitting: Interventional Radiology

## 2013-09-05 ENCOUNTER — Encounter: Payer: Self-pay | Admitting: Physician Assistant

## 2013-09-05 ENCOUNTER — Telehealth (HOSPITAL_COMMUNITY): Payer: Self-pay | Admitting: Radiology

## 2013-09-05 ENCOUNTER — Other Ambulatory Visit: Payer: Self-pay | Admitting: *Deleted

## 2013-09-05 ENCOUNTER — Ambulatory Visit (INDEPENDENT_AMBULATORY_CARE_PROVIDER_SITE_OTHER): Payer: No Typology Code available for payment source | Admitting: Physician Assistant

## 2013-09-05 VITALS — BP 100/70 | HR 92 | Temp 97.8°F | Ht 71.0 in | Wt 248.2 lb

## 2013-09-05 DIAGNOSIS — K750 Abscess of liver: Secondary | ICD-10-CM

## 2013-09-05 DIAGNOSIS — R110 Nausea: Secondary | ICD-10-CM

## 2013-09-05 MED ORDER — ONDANSETRON HCL 4 MG PO TABS: ORAL_TABLET | ORAL | Status: DC

## 2013-09-05 NOTE — Progress Notes (Signed)
Subjective:    Patient ID: Nicholas Bradford, male    DOB: 10-Apr-1976, 37 y.o.   MRN: [id number]  HPI   Nicholas Bradford is a pleasant 37 year old white male who comes in today with complaints of nausea. He was just discharged from the hospital on 08/24/2013 after hospitalization with several month history of nausea and vomiting with associated weight loss and was found to have a large hepatic abscess on CT scan.  CT done 1118 had shown a complex septated hepatic abscess measuring 9.4 x 11.3 x 9 cm in the right lobe of the liver. He underwent percutaneous drainage with improvement in his nausea and appetite.  He was discharged home with a percutaneous drain, and a PICC line for IV ceftriaxone. He was also discharged on oral Flagyl. He says he did well for about a week after discharge then began experiencing increased nausea. He called over the weekend and was told by our M.D. On call to stop Flagyl. He says he does feel better with decreased nausea over the past 24 hours. He mentions that he has not been able to flush his drainage catheter well over the past 2 days either and is not getting any return. He has not been aware of any fevers and really has no complaints of abdominal pain. Last CBC checked on 09/03/2013 show WBC of 7.2 hemoglobin 11.9 hematocrit of 36.7.    Review of Systems  Constitutional: Positive for appetite change and unexpected weight change.  HENT: Negative.   Eyes: Negative.   Respiratory: Negative.   Cardiovascular: Negative.   Gastrointestinal: Positive for nausea.  Endocrine: Negative.   Genitourinary: Negative.   Musculoskeletal: Negative.   Allergic/Immunologic: Negative.   Neurological: Negative.   Hematological: Negative.   Psychiatric/Behavioral: Negative.    Outpatient Prescriptions Prior to Visit  Medication Sig Dispense Refill  . clonazePAM (KLONOPIN) 0.5 MG tablet Take 0.5 mg by mouth 4 (four) times daily.       Marland Kitchen dextrose 5 % SOLN 50 mL with cefTRIAXone 2 G SOLR  2 g Inject 2 g into the vein daily. 2 weeks starting 11/26      . esomeprazole (NEXIUM) 20 MG capsule Take 20 mg by mouth daily at 12 noon.      Marland Kitchen HYDROcodone-acetaminophen (NORCO/VICODIN) 5-325 MG per tablet Take 1 tablet by mouth every 6 (six) hours as needed for moderate pain.      . hydrocortisone-pramoxine (ANALPRAM-HC) 2.5-1 % rectal cream Place rectally 3 (three) times daily as needed for hemorrhoids or itching.  30 g  0  . Multiple Vitamin (MULTIVITAMIN WITH MINERALS) TABS tablet Take 1 tablet by mouth daily.      . polyethylene glycol (MIRALAX / GLYCOLAX) packet Take 17 g by mouth daily.  14 each  0  . metroNIDAZOLE (FLAGYL) 500 MG tablet Take 500 mg by mouth every 8 (eight) hours. Started 11/26 for 21 days       No facility-administered medications prior to visit.   Allergies  Allergen Reactions  . Flagyl [Metronidazole] Nausea Only       Patient Active Problem List   Diagnosis Date Noted  . Anal fissure 08/22/2013  . Liver abscess 08/20/2013  . Nausea with vomiting 08/08/2013  . Internal hemorrhoids with other complication 08/08/2013  . Schizoaffective disorder 07/01/2013  . GERD (gastroesophageal reflux disease) 07/01/2013  . Morbid obesity 07/01/2013  . Obstructive sleep apnea 07/01/2013  . Irritable bowel syndrome (IBS)    History  Substance Use Topics  . Smoking status: Former  Smoker -- 0.25 packs/day for .3 years    Types: Cigarettes    Quit date: 09/30/1995  . Smokeless tobacco: Never Used     Comment: Pt only smoked x 3-4 months total. 2nd hand smoke exposure x 30+years.  . Alcohol Use: No   family history includes Alcohol abuse in his father; Breast cancer in his mother; Cirrhosis in his father; Diabetes in his mother; Heart disease in his paternal grandfather. There is no history of Colon cancer or Kidney disease.   Objective:   Physical Exam well-developed white male in no acute distress, pleasant blood pressure 100/70 pulse 92 temperature 97.8 height 5  foot 11 weight 248. HEENT; nontraumatic normocephalic EOMI PERRLA sclera anicteric,Neck; Supple no JVD, Cardiovascular; regular rate and rhythm with S1-S2 no murmur or gallop, Pulmonary ;clear bilaterally, Abdomen; large, soft basically nontender, no palpable mass ,or hepatosplenomegaly -he does have a percutaneous drain in the right upper quadrant dressing is moist there is about 10 cc of cloudy whitish material in the drainage bulb. Extremities ;no clubbing cyanosis or edema skin warm and dry, Psych; mood and affect normal and appropriate        Assessment & Plan:  #13  37 year old male with right lobe liver large hepatic abscess which had been associated with persistent nausea and weight loss now status post percutaneous drainage 08/17/2013. male with right lobe liver large hepatic abscess which had been associated with persistent nausea and weight loss now status post percutaneous drainage 08/17/2013. Patient is on IV ceftriaxone via PICC line and had also been discharged on oral Flagyl. #2 increased nausea over the past 48 hours improved off Flagyl Certainly Flagyl may have been causing nausea but also concerned that he may have some reaccumulation of his abscess as this was septated and his drain has not been flushing properly x2 days.  Plan; patient is scheduled for followup CT scan of the abdomen and pelvis tomorrow 09/06/2013 he will also have evaluation by IR while in radiology as his catheter may need to be adjusted  Continue IV ceftriaxone Leave off Flagyl Zofran 4 mg every 6 hours when necessary  Patient has an appointment to see Dr. Jerolyn Center for infectious disease on 09/15/2013 and will need to keep that appointment Further plans based on CT findings

## 2013-09-05 NOTE — Patient Instructions (Signed)
Start Nexium 40 mg 1 cap every morning. We sent a  prescription for Zofran 4 mg to [facility].  You have an appointment with Dr.Cynthia  [facility] for In[facility], [address]  [address]. Phone number: [phone]  Date: 09-15-2013 Time: 2:00 PM   .

## 2013-09-05 NOTE — Telephone Encounter (Signed)
Rec'd call from Dr. Archer Asa to schedule pt for CT abd and follow up with IR Dr.  Jeanene Erb patient and scheduled for Tuesday 12-9 arrive at 8:45am, liquids only after midnight.  CT and IR aware of appt.

## 2013-09-06 ENCOUNTER — Other Ambulatory Visit: Payer: Self-pay | Admitting: Radiology

## 2013-09-06 ENCOUNTER — Ambulatory Visit (HOSPITAL_COMMUNITY)
Admission: RE | Admit: 2013-09-06 | Discharge: 2013-09-06 | Disposition: A | Discharge status: Home / Self Care | Payer: No Typology Code available for payment source | Source: Ambulatory Visit | Attending: Interventional Radiology | Admitting: Interventional Radiology

## 2013-09-06 ENCOUNTER — Encounter (HOSPITAL_COMMUNITY): Payer: Self-pay | Admitting: Pharmacy Technician

## 2013-09-06 DIAGNOSIS — M479 Spondylosis, unspecified: Secondary | ICD-10-CM | POA: Insufficient documentation

## 2013-09-06 DIAGNOSIS — K750 Abscess of liver: Secondary | ICD-10-CM

## 2013-09-06 DIAGNOSIS — J90 Pleural effusion, not elsewhere classified: Secondary | ICD-10-CM | POA: Insufficient documentation

## 2013-09-06 MED ORDER — IOHEXOL 300 MG/ML  SOLN
100.0000 mL | Freq: Once | INTRAMUSCULAR | Status: AC | PRN
  Administered 2013-09-06: 100 mL via INTRAVENOUS

## 2013-09-06 NOTE — Progress Notes (Signed)
Reviewed and agree with management. Robert D. Kaplan, M.D., FACG  

## 2013-09-07 ENCOUNTER — Ambulatory Visit (HOSPITAL_COMMUNITY)
Admission: RE | Admit: 2013-09-07 | Discharge: 2013-09-07 | Disposition: A | Discharge status: Home / Self Care | Payer: No Typology Code available for payment source | Source: Ambulatory Visit | Attending: Interventional Radiology | Admitting: Interventional Radiology

## 2013-09-07 ENCOUNTER — Other Ambulatory Visit (HOSPITAL_COMMUNITY): Payer: Self-pay | Admitting: Interventional Radiology

## 2013-09-07 ENCOUNTER — Telehealth: Payer: Self-pay

## 2013-09-07 ENCOUNTER — Encounter (HOSPITAL_COMMUNITY): Payer: Self-pay

## 2013-09-07 DIAGNOSIS — K750 Abscess of liver: Secondary | ICD-10-CM | POA: Insufficient documentation

## 2013-09-07 MED ORDER — IOHEXOL 300 MG/ML  SOLN
10.0000 mL | Freq: Once | INTRAMUSCULAR | Status: AC | PRN
  Administered 2013-09-07: 10 mL

## 2013-09-07 MED ORDER — SODIUM CHLORIDE 0.9 % IV SOLN
Freq: Once | INTRAVENOUS | Status: AC
  Administered 2013-09-07: 14:00:00 via INTRAVENOUS

## 2013-09-07 NOTE — Telephone Encounter (Signed)
Message copied by Chrystie Nose on Wed Sep 07, 2013  3:41 PM ------      Message from: Sandoval, Virginia S      Created: Wed Sep 07, 2013  3:27 PM       Bonita Quin- IR called about this pt- had a liver abscess- drain removed today- has tiny residual  Abscess. Dr. Fredia Sorrow  Felt pt shoule have one more week of IV Ceftriaxone  2gm  Daily. He took his last dose today- please order 7 more days thru  [facility]. Pt has appt with Id next week. Please also call pt and let him know we are continuing x one more week, Thanks ------

## 2013-09-07 NOTE — Progress Notes (Signed)
Patient to be discharged from IR after procedure.

## 2013-09-07 NOTE — Telephone Encounter (Signed)
Spoke with Tildon Husky RN at [facility] and ordered 7 more days of IV Rocephin. Pt aware.

## 2013-09-08 NOTE — ED Provider Notes (Signed)
Medical screening examination/treatment/procedure(s) were conducted as a shared visit with non-physician practitioner(s) and myself.  I personally evaluated the patient during the encounter.  EKG Interpretation   None       Patient is well appearing with benign exam with his drain being unable to flush. No signs of repeat infection or systemic signs. Will f/u ASAP as outpatient for likely replacement vs removal.  Audree Camel, MD 09/08/13 1238

## 2013-09-09 ENCOUNTER — Telehealth: Payer: Self-pay | Admitting: *Deleted

## 2013-09-09 ENCOUNTER — Telehealth: Payer: Self-pay | Admitting: Physician Assistant

## 2013-09-09 NOTE — Telephone Encounter (Signed)
Spoke with patient and he states when he took his Ceftriaxone IV on Wednesday, he had a reaction to it. States he had burning all over his body and itching. His face turned red and he vomited. States he did not take any antibiotics yesterday. He did not call yesterday because he slept all day. Please, advise.

## 2013-09-09 NOTE — Telephone Encounter (Signed)
Wants to know if she needs to draw baseline labs today or Monday. Per Mike Gip, PA, may draw any labs needed per protocol.

## 2013-09-09 NOTE — Telephone Encounter (Signed)
See previous phone note.  

## 2013-09-09 NOTE — Telephone Encounter (Signed)
Ok- stop Rocephin. I spoke with Pharm D at [facility]- Please call [facility] and switch him to IV Levaquin 750 mg IV daily x 7 days. Make sure he keeps the appt with Dr. Ilsa Iha ID next week

## 2013-09-09 NOTE — Telephone Encounter (Signed)
Spoke with Thayer Ohm, RN with [facility] and gave her orders for Levaquin 750 mg IV daily x 7 days. She will tell him to keep OV with Dr. Ilsa Iha next week.

## 2013-09-15 ENCOUNTER — Ambulatory Visit (INDEPENDENT_AMBULATORY_CARE_PROVIDER_SITE_OTHER): Payer: No Typology Code available for payment source | Admitting: Internal Medicine

## 2013-09-15 ENCOUNTER — Encounter: Payer: Self-pay | Admitting: Internal Medicine

## 2013-09-15 ENCOUNTER — Telehealth: Payer: Self-pay | Admitting: *Deleted

## 2013-09-15 VITALS — BP 122/86 | HR 103 | Temp 97.7°F | Ht 71.0 in | Wt 248.0 lb

## 2013-09-15 DIAGNOSIS — K750 Abscess of liver: Secondary | ICD-10-CM

## 2013-09-15 NOTE — Assessment & Plan Note (Signed)
He will finish his last dose of Levaquin today and then I will have him stop his antibiotics. Home health will pull his PICC line. He will return on a p.r.n. basis

## 2013-09-15 NOTE — Telephone Encounter (Signed)
Verbal order per Dr. Luciana Axe given to [facility] to pull patient's picc line after last antibiotic dose on 09/16/13. Wendall Mola

## 2013-09-15 NOTE — Progress Notes (Signed)
   Subjective:    Patient ID: Nicholas Bradford, male    DOB: 05-02-1976, 37 y.o.   MRN: [id number]  HPI Nicholas Bradford is a 37 y.o. male with history of schizoaffective disorder, IBS and sleep apnea referred for evaluation of chronic nausea, vomiting and rectal pain. He was referred to [facility] for evaluation of significant nausea, vomiting with associated weight loss, of reported 50 pounds over the past 6 months.he reports having nightsweats and feeling hot.  ABd CT scan found a complex septated hepatic abscess measuring 9.4 x 11.3 x 9 cm.  He had evaluation had it drained by interventional radiology and was noted to be pus. The culture grew out mixed culture. He was placed on Rocephin and Flagyl and had his drain removed over a week ago. He did have some problems with chills and rash with Rocephin and this was changed to Levaquin monotherapy. That was one week ago and is finishing her seventh dose today. On scan when he had his drain removed it noted complete resolution of the abscess. He has had no fever or chills and his symptoms have essentially resolved other than some mild lingering weakness.  No international travel    Review of Systems  Constitutional: Positive for fatigue. Negative for fever and chills.  Gastrointestinal: Negative for nausea, abdominal pain, diarrhea and abdominal distention.  Musculoskeletal: Negative for joint swelling.  Skin: Negative for rash.  Neurological: Negative for dizziness and light-headedness.       Objective:   Physical Exam  Constitutional: He appears well-developed and well-nourished. No distress.  HENT:  Mouth/Throat: No oropharyngeal exudate.  Eyes: No scleral icterus.  Cardiovascular: Normal rate, regular rhythm and normal heart sounds.   No murmur heard. Pulmonary/Chest: Effort normal and breath sounds normal. No respiratory distress.  Abdominal: Soft. Bowel sounds are normal. He exhibits no distension. There is no tenderness. There is no  rebound.  Skin: No rash noted.  Psychiatric: He has a normal mood and affect.          Assessment & Plan:

## 2013-09-16 ENCOUNTER — Telehealth: Payer: Self-pay | Admitting: Gastroenterology

## 2013-09-16 NOTE — Telephone Encounter (Signed)
Left message for nurse that pt saw ID doc yesterday and that she should probably check with Dr. Luciana Axe regarding that order.

## 2013-10-17 ENCOUNTER — Encounter: Payer: Self-pay | Admitting: Internal Medicine

## 2013-10-26 ENCOUNTER — Encounter: Payer: Self-pay | Admitting: Family Medicine

## 2013-10-26 ENCOUNTER — Ambulatory Visit (INDEPENDENT_AMBULATORY_CARE_PROVIDER_SITE_OTHER): Payer: BC Managed Care – PPO | Admitting: Family Medicine

## 2013-10-26 VITALS — BP 138/84 | HR 77 | Temp 97.9°F | Ht 71.0 in | Wt 280.8 lb

## 2013-10-26 DIAGNOSIS — G4733 Obstructive sleep apnea (adult) (pediatric): Secondary | ICD-10-CM

## 2013-10-26 DIAGNOSIS — K750 Abscess of liver: Secondary | ICD-10-CM

## 2013-10-26 NOTE — Progress Notes (Signed)
Date:  10/26/2013   Name:  Nicholas Bradford   DOB:  02-Aug-1976   MRN:  [id number] Gender: male Age: 38 y.o.  Primary Physician:  Owens Loffler, MD   Chief Complaint: Follow-up and Insomnia   Subjective:   History of Present Illness:  Nicholas Bradford is a 38 y.o. pleasant patient who presents with the following:  S/p complex hosp and repetitive ER visits with ultimately a large liver abscess, admitted and drained via IR and placed on ABX. ID also followed the patient. ABd CT scan found a complex septated hepatic abscess measuring 9.4 x 11.3 x 9 cm. He had evaluation had it drained by interventional radiology and was noted to be pus. The culture grew out mixed culture. He was placed on Rocephin and Flagyl and had his drain removed over a week ago. He did have some problems with chills and rash with Rocephin and this was changed to Levaquin monotherapy.  He now is feeling better, except for his OSA  Date of Admission: 08/19/2013 11:19 AM  Date of Discharge: 08/24/2013  Primary Gastroenterologist: Erskine Emery, MD  Discharging Physician: Lucio Edward, MD   Discharge Diagnosis:  1. Chronic nausea and vomiting. Unclear etiology. Improved  2. Large liver abscess, s/p CT guided drainage and percutaneous drain placement by IR.  3. Perianal ulcerations / hemorrhoid / ? fissure. Conservative management  4. Gastritis (endoscopically). Biopsies normal.   Consultations:  1. General surgery.  2. Interventional Radiology  3. Infectious Disease   Procedures Performed:  Ct Guided Abscess Drain   Having some hard time sleeping, exercising and  [facility] in [place] -  [place], [facility])  Pristiq and Klonopin --- Dr. Toy Care in [facility]   Patient Active Problem List   Diagnosis Date Noted  . Anal fissure 08/22/2013  . Liver abscess 08/20/2013  . Internal hemorrhoids with other complication 03/40/3524  .  Schizoaffective disorder 07/01/2013  . GERD (gastroesophageal reflux disease) 07/01/2013  . Morbid obesity 07/01/2013  . Obstructive sleep apnea 07/01/2013  . Irritable bowel syndrome (IBS)     Past Medical History  Diagnosis Date  . GERD (gastroesophageal reflux disease)   . Schizoaffective disorder 07/01/2013  . Irritable bowel syndrome (IBS)   . Morbid obesity 07/01/2013  . Obstructive sleep apnea 07/01/2013  . Liver abscess     Past Surgical History  Procedure Laterality Date  . Appendectomy  1990  . Liver surgery      has a pick line, tube for liver abscess    History   Social History  . Marital Status: Married    Spouse Name: N/A    Number of Children: 0  . Years of Education: N/A   Occupational History  . unemployeed [employer]   Social History Main Topics  . Smoking status: Former Smoker -- 0.25 packs/day for .3 years    Types: Cigarettes    Quit date: 09/30/1995  . Smokeless tobacco: Never Used     Comment: Pt only smoked x 3-4 months total. 2nd hand smoke exposure x 30+years.  . Alcohol Use: No  . Drug Use: No  . Sexual Activity: Yes    Partners: Female   Other Topics Concern  . Not on file   Social History Narrative   Now unemployed    Married    Family History  Problem Relation Age of Onset  . Breast cancer Mother   . Colon cancer Neg Hx   .  Alcohol abuse Father   . Heart disease Paternal Grandfather   . Diabetes Mother   . Kidney disease Neg Hx   . Cirrhosis Father     alcohol related    Allergies  Allergen Reactions  . Flagyl [Metronidazole] Nausea Only  . Rocephin [Ceftriaxone] Nausea And Vomiting and Rash    Medication list has been reviewed and updated.  Review of Systems:   GEN: No acute illnesses, no fevers, chills. GI: No n/v/d, eating normally Pulm: No SOB Interactive and getting along well at home.  Otherwise, ROS is as per the HPI.  Objective:   Physical Examination: BP 138/84  Pulse 77   Temp(Src) 97.9 F (36.6 C) (Oral)  Ht '5\' 11"'  (1.803 m)  Wt 280 lb 12 oz (127.347 kg)  BMI 39.17 kg/m2  Ideal Body Weight: Weight in (lb) to have BMI = 25: 178.9   GEN: WDWN, NAD, Non-toxic, A & O x 3 HEENT: Atraumatic, Normocephalic. Neck supple. No masses, No LAD. Ears and Nose: No external deformity. CV: RRR, No M/G/R. No JVD. No thrill. No extra heart sounds. PULM: CTA B, no wheezes, crackles, rhonchi. No retractions. No resp. distress. No accessory muscle use. EXTR: No c/c/e NEURO Normal gait.  PSYCH: Normally interactive. Conversant. Not depressed or anxious appearing.  Calm demeanor.   Laboratory and Imaging Data:  Recent Results (from the past 2160 hour(s))  CBC WITH DIFFERENTIAL     Status: Abnormal   Collection Time    08/01/13  2:21 PM      Result Value Range   WBC 17.2 (*) 4.0 - 10.5 K/uL   RBC 4.17 (*) 4.22 - 5.81 MIL/uL   Hemoglobin 12.3 (*) 13.0 - 17.0 g/dL   HCT 35.5 (*) 39.0 - 52.0 %   MCV 85.1  78.0 - 100.0 fL   MCH 29.5  26.0 - 34.0 pg   MCHC 34.6  30.0 - 36.0 g/dL   RDW 13.8  11.5 - 15.5 %   Platelets 270  150 - 400 K/uL   Neutrophils Relative % 68  43 - 77 %   Lymphocytes Relative 18  12 - 46 %   Monocytes Relative 14 (*) 3 - 12 %   Eosinophils Relative 0  0 - 5 %   Basophils Relative 0  0 - 1 %   Neutro Abs 11.7 (*) 1.7 - 7.7 K/uL   Lymphs Abs 3.1  0.7 - 4.0 K/uL   Monocytes Absolute 2.4 (*) 0.1 - 1.0 K/uL   Eosinophils Absolute 0.0  0.0 - 0.7 K/uL   Basophils Absolute 0.0  0.0 - 0.1 K/uL   WBC Morphology MILD LEFT SHIFT (1-5% METAS, OCC MYELO, OCC BANDS)     Comment: ATYPICAL LYMPHOCYTES     TOXIC GRANULATION     VACUOLATED NEUTROPHILS   Smear Review LARGE PLATELETS PRESENT    COMPREHENSIVE METABOLIC PANEL     Status: Abnormal   Collection Time    08/01/13  2:21 PM      Result Value Range   Sodium 129 (*) 135 - 145 mEq/L   Potassium 3.4 (*) 3.5 - 5.1 mEq/L   Chloride 94 (*) 96 - 112 mEq/L   CO2 22  19 - 32 mEq/L   Glucose, Bld 129 (*) 70  - 99 mg/dL   BUN 10  6 - 23 mg/dL   Creatinine, Ser 0.86  0.50 - 1.35 mg/dL   Calcium 9.7  8.4 - 10.5 mg/dL   Total Protein 8.3  6.0 - 8.3 g/dL   Albumin 3.0 (*) 3.5 - 5.2 g/dL   AST 19  0 - 37 U/L   ALT 26  0 - 53 U/L   Alkaline Phosphatase 93  39 - 117 U/L   Total Bilirubin 0.8  0.3 - 1.2 mg/dL   GFR calc non Af Amer >90  >90 mL/min   GFR calc Af Amer >90  >90 mL/min   Comment: (NOTE)     The eGFR has been calculated using the CKD EPI equation.     This calculation has not been validated in all clinical situations.     eGFR's persistently <90 mL/min signify possible Chronic Kidney     Disease.  PATHOLOGIST SMEAR REVIEW     Status: None   Collection Time    08/01/13  2:21 PM      Result Value Range   Path Review Reviewed By Violet Baldy, M.D.     Comment: CIRCULATING HYPERSEGMENTED NEUTROPHILS AND REACTIVE LYMPHOCYTES PRESNT.  COMPREHENSIVE METABOLIC PANEL     Status: Abnormal   Collection Time    08/17/13 10:24 AM      Result Value Range   Sodium 129 (*) 135 - 145 mEq/L   Potassium 4.3  3.5 - 5.1 mEq/L   Chloride 94 (*) 96 - 112 mEq/L   CO2 27  19 - 32 mEq/L   Glucose, Bld 111 (*) 70 - 99 mg/dL   BUN 7  6 - 23 mg/dL   Creatinine, Ser 0.9  0.4 - 1.5 mg/dL   Total Bilirubin 0.9  0.3 - 1.2 mg/dL   Alkaline Phosphatase 82  39 - 117 U/L   AST 25  0 - 37 U/L   ALT 22  0 - 53 U/L   Total Protein 7.5  6.0 - 8.3 g/dL   Albumin 2.4 (*) 3.5 - 5.2 g/dL   Calcium 8.7  8.4 - 10.5 mg/dL   GFR 103.82  >60.00 mL/min  CBC WITH DIFFERENTIAL     Status: Abnormal   Collection Time    08/17/13 10:24 AM      Result Value Range   WBC 14.9 (*) 4.5 - 10.5 K/uL   RBC 3.94 (*) 4.22 - 5.81 Mil/uL   Hemoglobin 11.1 (*) 13.0 - 17.0 g/dL   HCT 33.3 (*) 39.0 - 52.0 %   MCV 84.5  78.0 - 100.0 fl   MCHC 33.5  30.0 - 36.0 g/dL   RDW 15.1 (*) 11.5 - 14.6 %   Platelets 323.0  150.0 - 400.0 K/uL   Neutrophils Relative % 68.9  43.0 - 77.0 %   Lymphocytes Relative 23.0  12.0 - 46.0 %   Monocytes  Relative 6.9  3.0 - 12.0 %   Eosinophils Relative 0.6  0.0 - 5.0 %   Basophils Relative 0.6  0.0 - 3.0 %   Neutro Abs 10.3 (*) 1.4 - 7.7 K/uL   Lymphs Abs 3.4  0.7 - 4.0 K/uL   Monocytes Absolute 1.0  0.1 - 1.0 K/uL   Eosinophils Absolute 0.1  0.0 - 0.7 K/uL   Basophils Absolute 0.1  0.0 - 0.1 K/uL  CULTURE, BLOOD (ROUTINE X 2)     Status: None   Collection Time    08/19/13  2:45 PM      Result Value Range   Specimen Description BLOOD RIGHT HAND     Special Requests BOTTLES DRAWN AEROBIC AND ANAEROBIC 10CC     Culture  Setup Time  Value: 08/19/2013 20:18     Performed at [facility]   Culture       Value: NO GROWTH 5 DAYS     Performed at [facility]   Report Status 08/25/2013 FINAL    CULTURE, BLOOD (ROUTINE X 2)     Status: None   Collection Time    08/19/13  3:05 PM      Result Value Range   Specimen Description BLOOD RIGHT ARM     Special Requests BOTTLES DRAWN AEROBIC AND ANAEROBIC 10CC     Culture  Setup Time       Value: 08/19/2013 20:18     Performed at [facility]   Culture       Value: NO GROWTH 5 DAYS     Performed at [facility]   Report Status 08/25/2013 FINAL    HIV ANTIBODY (ROUTINE TESTING)     Status: None   Collection Time    08/19/13  4:30 PM      Result Value Range   HIV NON REACTIVE  NON REACTIVE   Comment: Performed at [facility]  CBC WITH DIFFERENTIAL     Status: Abnormal   Collection Time    08/19/13  4:30 PM      Result Value Range   WBC 13.0 (*) 4.0 - 10.5 K/uL   RBC 3.30 (*) 4.22 - 5.81 MIL/uL   Hemoglobin 9.2 (*) 13.0 - 17.0 g/dL   HCT 28.2 (*) 39.0 - 52.0 %   MCV 85.5  78.0 - 100.0 fL   MCH 27.9  26.0 - 34.0 pg   MCHC 32.6  30.0 - 36.0 g/dL   RDW 14.6  11.5 - 15.5 %   Platelets 320  150 - 400 K/uL   Neutrophils Relative % 62  43 - 77 %   Neutro Abs 8.1 (*) 1.7 - 7.7 K/uL   Lymphocytes Relative 28  12 - 46 %   Lymphs Abs 3.6  0.7 - 4.0 K/uL   Monocytes Relative 9  3 - 12 %    Monocytes Absolute 1.2 (*) 0.1 - 1.0 K/uL   Eosinophils Relative 1  0 - 5 %   Eosinophils Absolute 0.1  0.0 - 0.7 K/uL   Basophils Relative 0  0 - 1 %   Basophils Absolute 0.0  0.0 - 0.1 K/uL  COMPREHENSIVE METABOLIC PANEL     Status: Abnormal   Collection Time    08/19/13  4:30 PM      Result Value Range   Sodium 130 (*) 135 - 145 mEq/L   Potassium 4.3  3.5 - 5.1 mEq/L   Chloride 95 (*) 96 - 112 mEq/L   CO2 24  19 - 32 mEq/L   Glucose, Bld 103 (*) 70 - 99 mg/dL   BUN 5 (*) 6 - 23 mg/dL   Creatinine, Ser 0.76  0.50 - 1.35 mg/dL   Calcium 8.9  8.4 - 10.5 mg/dL   Total Protein 6.9  6.0 - 8.3 g/dL   Albumin 2.2 (*) 3.5 - 5.2 g/dL   AST 22  0 - 37 U/L   ALT 14  0 - 53 U/L   Alkaline Phosphatase 83  39 - 117 U/L   Total Bilirubin 0.5  0.3 - 1.2 mg/dL   GFR calc non Af Amer >90  >90 mL/min   GFR calc Af Amer >90  >90 mL/min   Comment: (NOTE)     The eGFR has  been calculated using the CKD EPI equation.     This calculation has not been validated in all clinical situations.     eGFR's persistently <90 mL/min signify possible Chronic Kidney     Disease.  PROTIME-INR     Status: Abnormal   Collection Time    08/20/13  5:15 AM      Result Value Range   Prothrombin Time 16.4 (*) 11.6 - 15.2 seconds   INR 1.36  0.00 - 1.49  APTT     Status: Abnormal   Collection Time    08/20/13  5:15 AM      Result Value Range   aPTT 45 (*) 24 - 37 seconds   Comment:            IF BASELINE aPTT IS ELEVATED,     SUGGEST PATIENT RISK ASSESSMENT     BE USED TO DETERMINE APPROPRIATE     ANTICOAGULANT THERAPY.  CBC     Status: Abnormal   Collection Time    08/20/13  5:15 AM      Result Value Range   WBC 13.1 (*) 4.0 - 10.5 K/uL   RBC 3.38 (*) 4.22 - 5.81 MIL/uL   Hemoglobin 9.4 (*) 13.0 - 17.0 g/dL   HCT 28.6 (*) 39.0 - 52.0 %   MCV 84.6  78.0 - 100.0 fL   MCH 27.8  26.0 - 34.0 pg   MCHC 32.9  30.0 - 36.0 g/dL   RDW 14.7  11.5 - 15.5 %   Platelets 262  150 - 400 K/uL  CULTURE,  ROUTINE-ABSCESS     Status: None   Collection Time    08/20/13  4:21 PM      Result Value Range   Specimen Description LIVER     Special Requests Normal     Gram Stain       Value: ABUNDANT WBC PRESENT,BOTH PMN AND MONONUCLEAR     NO SQUAMOUS EPITHELIAL CELLS SEEN     NO ORGANISMS SEEN     Performed at [facility]   Culture       Value: NO GROWTH 3 DAYS     Performed at [facility]   Report Status 08/23/2013 FINAL    ANAEROBIC CULTURE     Status: None   Collection Time    08/20/13  4:21 PM      Result Value Range   Specimen Description LIVER     Special Requests Normal     Gram Stain       Value: NO WBC SEEN     RARE SQUAMOUS EPITHELIAL CELLS PRESENT     RARE GRAM POSITIVE RODS     RARE GRAM NEGATIVE RODS     RARE GRAM POSITIVE COCCI   Culture       Value: MULTIPLE ORGANISMS PRESENT, NONE PREDOMINANT     Performed at [facility]   Report Status 08/25/2013 FINAL    CBC     Status: Abnormal   Collection Time    08/23/13  5:02 AM      Result Value Range   WBC 6.8  4.0 - 10.5 K/uL   RBC 3.42 (*) 4.22 - 5.81 MIL/uL   Hemoglobin 9.4 (*) 13.0 - 17.0 g/dL   HCT 29.6 (*) 39.0 - 52.0 %   MCV 86.5  78.0 - 100.0 fL   MCH 27.5  26.0 - 34.0 pg   MCHC 31.8  30.0 - 36.0 g/dL   RDW 14.9  11.5 - 15.5 %   Platelets 348  150 - 400 K/uL  BASIC METABOLIC PANEL     Status: Abnormal   Collection Time    08/23/13  5:02 AM      Result Value Range   Sodium 137  135 - 145 mEq/L   Potassium 4.2  3.5 - 5.1 mEq/L   Chloride 102  96 - 112 mEq/L   CO2 26  19 - 32 mEq/L   Glucose, Bld 112 (*) 70 - 99 mg/dL   BUN 6  6 - 23 mg/dL   Creatinine, Ser 0.78  0.50 - 1.35 mg/dL   Calcium 8.8  8.4 - 10.5 mg/dL   GFR calc non Af Amer >90  >90 mL/min   GFR calc Af Amer >90  >90 mL/min   Comment: (NOTE)     The eGFR has been calculated using the CKD EPI equation.     This calculation has not been validated in all clinical situations.     eGFR's persistently <90 mL/min signify  possible Chronic Kidney     Disease.  CBC WITH DIFFERENTIAL     Status: Abnormal   Collection Time    09/03/13  7:10 PM      Result Value Range   WBC 7.2  4.0 - 10.5 K/uL   RBC 4.32  4.22 - 5.81 MIL/uL   Hemoglobin 11.9 (*) 13.0 - 17.0 g/dL   HCT 36.7 (*) 39.0 - 52.0 %   MCV 85.0  78.0 - 100.0 fL   MCH 27.5  26.0 - 34.0 pg   MCHC 32.4  30.0 - 36.0 g/dL   RDW 15.9 (*) 11.5 - 15.5 %   Platelets 269  150 - 400 K/uL   Neutrophils Relative % 29 (*) 43 - 77 %   Neutro Abs 2.1  1.7 - 7.7 K/uL   Lymphocytes Relative 51 (*) 12 - 46 %   Lymphs Abs 3.6  0.7 - 4.0 K/uL   Monocytes Relative 10  3 - 12 %   Monocytes Absolute 0.7  0.1 - 1.0 K/uL   Eosinophils Relative 9 (*) 0 - 5 %   Eosinophils Absolute 0.6  0.0 - 0.7 K/uL   Basophils Relative 1  0 - 1 %   Basophils Absolute 0.1  0.0 - 0.1 K/uL    Ir Sinus/fist Tube Chk-non Gi  09/07/2013   CLINICAL DATA:  Status post percutaneous drainage of hepatic abscess on 08/20/2013. CT has shown some residual undrained fluid in the liver.  EXAM: SINUS TRACT INJECTION/ ABSCESSOGRAM  TECHNIQUE: A pre-existing hepatic drainage catheter was aspirated and flushed with saline. Contrast material was injected via the drain. This was then aspirated. The drain was cut and removed. A dressing was applied at the catheter exit site.  COMPARISON:  CT of the abdomen on 09/06/2013.  FLUOROSCOPY TIME:  6 seconds.  FINDINGS: Aspiration yielded no significant fluid from the pre-existing drainage catheter which is in stable position within the posterior and superior aspect of the right lobe of the liver. Clinically, there is no significant further drainage from the catheter. Contrast injection shows filling of a small irregular space adjacent to the pigtail portion of the drainage catheter without evidence of fistula to the biliary tree or significant residual abscess cavity.  Review of the recent CT shows only some residual small pockets of fluid superior to the drainage catheter.  There would be no way to reposition the current drainage catheter into that region and residual small  amounts of fluid are not large enough to currently warrant placement of a new percutaneous drain. Residual fluid may respond adequately to continued antibiotics. Additional percutaneous drainage would not be recommended unless there is enlargement of a dominant abscess.  IMPRESSION: No significant abscess cavity remains at the level of the percutaneous drain within the liver. No fistula is identified to the biliary tree. There is no significant clinical operative from this current drain and the drainage catheter was removed. Small pockets of residual fluid superior to the drain are currently not large enough to warrant placement of a new drainage catheter.   Electronically Signed   By: Aletta Edouard M.D.   On: 09/07/2013 15:46   Assessment & Plan:    Liver abscess  Obstructive sleep apnea  >25 minutes spent in face to face time with patient, >50% spent in counselling or coordination of care  Doing much better. We will work to try to track down his prior sleep study. No further intervention needed for abscess.  Patient Instructions  [facility] in [place] -  [address] [place], [facility])   No orders of the defined types were placed in this encounter.    New medications, updates to list, dose adjustments: No orders of the defined types were placed in this encounter.    Signed,  Maud Deed. Alejandria Wessells, MD, [place] at [facility] [place] [address] Phone: [phone] Fax: [phone]    Medication List       This list is accurate as of: 10/26/13 11:59 PM.  Always use your most recent med list.               clonazePAM 0.5 MG tablet  Commonly known as:  KLONOPIN  Take 0.5 mg by mouth 3 (three) times daily as needed for anxiety.     esomeprazole 20 MG capsule    Commonly known as:  NEXIUM  Take 20 mg by mouth daily at 12 noon.     multivitamin with minerals Tabs tablet  Take 1 tablet by mouth daily.     polyethylene glycol packet  Commonly known as:  MIRALAX / GLYCOLAX  Take 17 g by mouth daily as needed for mild constipation or moderate constipation.

## 2013-10-26 NOTE — Progress Notes (Signed)
Pre-visit discussion using our clinic review tool. No additional management support is needed unless otherwise documented below in the visit note.  

## 2013-10-26 NOTE — Patient Instructions (Signed)
[facility] in [place] -  [phone]  [place], [phone] [facility])

## 2013-10-27 ENCOUNTER — Telehealth: Payer: Self-pay | Admitting: Family Medicine

## 2013-10-27 NOTE — Telephone Encounter (Signed)
Spoke with Elease Hashimotoatricia in medical records from [facility] regarding [facility] records, she stated she had no records of pt being seen there for sleep study. Spoke to patient again over the phone and he cannot remember anything else about sleep study other than [place] was in [place] a few years ago at the [facility]. Patient suggested he get another sleep study since we are unable to get previous records. I stated that he has a consult with Dr. Shelle Ironlance 2/23. Pt wants to know if you would like him to wait until visit with Dr. Shelle Ironlance or refer him for another sleep study?

## 2013-10-28 ENCOUNTER — Other Ambulatory Visit: Payer: Self-pay | Admitting: Pulmonary Disease

## 2013-10-28 DIAGNOSIS — G4733 Obstructive sleep apnea (adult) (pediatric): Secondary | ICD-10-CM

## 2013-10-28 NOTE — Telephone Encounter (Signed)
Thanks for the note.  I was not aware we are still struggling with this.  It sounds like he just needs to have a new study done, or we will not be able to get any equipment.   I will contact him and arrange.  Thanks.

## 2013-10-28 NOTE — Telephone Encounter (Signed)
Mellody DanceKeith, I just wanted to send you this note. No one can seem to track down his old sleep study, and he is willing to have a new one. He is scheduled to see you in a few weeks.  Thank-you very much for your help.  Hannah BeatSpencer Keishawna Carranza, MD

## 2013-10-28 NOTE — Telephone Encounter (Signed)
I am going to send a note to Dr. Shelle Ironlance, I think his office has also tried to get your old sleep study. I am going to completely defer to him, so you get the right study.

## 2013-10-31 ENCOUNTER — Encounter: Payer: Self-pay | Admitting: Pulmonary Disease

## 2013-10-31 ENCOUNTER — Ambulatory Visit (INDEPENDENT_AMBULATORY_CARE_PROVIDER_SITE_OTHER): Payer: BC Managed Care – PPO | Admitting: Pulmonary Disease

## 2013-10-31 VITALS — BP 132/94 | HR 63 | Temp 97.5°F | Ht 71.0 in | Wt 273.0 lb

## 2013-10-31 DIAGNOSIS — G4733 Obstructive sleep apnea (adult) (pediatric): Secondary | ICD-10-CM

## 2013-10-31 NOTE — Patient Instructions (Signed)
Will schedule for split night study, to verify your sleep apnea and also to determine your optimal cpap pressure. Will call to arrange updated equipment once your study results are available.

## 2013-10-31 NOTE — Progress Notes (Signed)
   Subjective:    Patient ID: Nicholas Bradford, male    DOB: 1976-09-23, 38 y.o.   MRN: [id number]  HPI The patient comes in today for followup of his obstructive sleep apnea. At his last visit, new equipment and settings were ordered for the patient, however his home care company refused to do so until we could produce his sleep study. A long search has been undertaken to locate his last study, but it was not able to be found. The patient has decided to go ahead and proceed with a new study in order to verify his sleep apnea, and enable him to get new supplies. Part of the issue is that he was distracted by ongoing treatment for liver abscess.   Review of Systems  Constitutional: Negative for fever and unexpected weight change.  HENT: Negative for congestion, dental problem, ear pain, nosebleeds, postnasal drip, rhinorrhea, sinus pressure, sneezing, sore throat and trouble swallowing.   Eyes: Negative for redness and itching.  Respiratory: Negative for cough, chest tightness, shortness of breath and wheezing.   Cardiovascular: Negative for palpitations and leg swelling.  Gastrointestinal: Negative for nausea and vomiting.  Genitourinary: Negative for dysuria.  Musculoskeletal: Negative for joint swelling.  Skin: Negative for rash.  Neurological: Negative for headaches.  Hematological: Does not bruise/bleed easily.  Psychiatric/Behavioral: Negative for dysphoric mood. The patient is not nervous/anxious.        Objective:   Physical Exam Overweight male in no acute distress Nose without purulence or discharge noted No skin breakdown or pressure necrosis from the CPAP mask Neck without lymphadenopathy or thyromegaly Lower extremities with minimal edema, no cyanosis Alert, does not appear to be sleepy, moves all 4 extremities.       Assessment & Plan:

## 2013-10-31 NOTE — Assessment & Plan Note (Signed)
The patient has a history of obstructive sleep apnea for which he is been on CPAP. He is now in need of new supplies and possibly a new CPAP device, but has been unable to get this for the last 2 months because his sleep study could not be located. In order to get him properly treated, will go ahead and proceed with a split night study to verify his sleep apnea and also determine his optimal pressure. The patient is agreeable to this approach.

## 2013-11-07 ENCOUNTER — Ambulatory Visit (HOSPITAL_BASED_OUTPATIENT_CLINIC_OR_DEPARTMENT_OTHER): Payer: BC Managed Care – PPO

## 2013-11-08 ENCOUNTER — Telehealth: Payer: Self-pay | Admitting: Pulmonary Disease

## 2013-11-08 NOTE — Telephone Encounter (Signed)
Will route to [facility] as a FYI

## 2013-11-10 ENCOUNTER — Telehealth: Payer: Self-pay | Admitting: *Deleted

## 2013-11-10 NOTE — Telephone Encounter (Signed)
PATIENT NO SHOWED APPOINTMENT AT THE SLEEP CENTER SCHEDULED 11/07/13

## 2013-11-11 ENCOUNTER — Ambulatory Visit: Payer: Self-pay | Admitting: Pulmonary Disease

## 2013-11-14 ENCOUNTER — Encounter (HOSPITAL_BASED_OUTPATIENT_CLINIC_OR_DEPARTMENT_OTHER): Payer: BC Managed Care – PPO

## 2013-11-21 ENCOUNTER — Ambulatory Visit: Payer: Self-pay | Admitting: Pulmonary Disease

## 2013-12-06 ENCOUNTER — Ambulatory Visit (HOSPITAL_BASED_OUTPATIENT_CLINIC_OR_DEPARTMENT_OTHER): Payer: BC Managed Care – PPO | Attending: Pulmonary Disease

## 2013-12-06 VITALS — Ht 71.0 in | Wt 273.0 lb

## 2013-12-06 DIAGNOSIS — Z9989 Dependence on other enabling machines and devices: Secondary | ICD-10-CM

## 2013-12-06 DIAGNOSIS — G4733 Obstructive sleep apnea (adult) (pediatric): Secondary | ICD-10-CM | POA: Insufficient documentation

## 2013-12-20 ENCOUNTER — Other Ambulatory Visit: Payer: Self-pay | Admitting: Pulmonary Disease

## 2013-12-20 ENCOUNTER — Telehealth: Payer: Self-pay | Admitting: Pulmonary Disease

## 2013-12-20 DIAGNOSIS — G4733 Obstructive sleep apnea (adult) (pediatric): Secondary | ICD-10-CM

## 2013-12-20 NOTE — Telephone Encounter (Signed)
LMOM x 1 

## 2013-12-20 NOTE — Telephone Encounter (Signed)
Please let pt know that his sleep study re-verified severe sleep apnea, with 91 apneas per hour of sleep during the night.  His cpap was titrated to 11cm, but he never reached dreaming.  Therefore he may need more pressure at home.  I would like to set his new cpap device on auto.  Order has been sent to dme.  I need to see pt back in 6 weeks.

## 2013-12-20 NOTE — Telephone Encounter (Signed)
Called, spoke with pt.  Explained below results and recs to [facility] per [facility]. He verbalized understanding of this and is in agreement with this plan.  Pt is aware order was sent to have new cpap device set on auto.  [facility] up on Jan 31, 2014 at 4 pm.  Pt aware of appt and is to call back if he has any problems with cpap or any problems with having cpap set up prior to appt.

## 2013-12-20 NOTE — Telephone Encounter (Signed)
Pt returning call.Nicholas Bradford ° °

## 2013-12-20 NOTE — Sleep Study (Signed)
   NAME: Nicholas Bradford DATE OF BIRTH:  [birth year] MEDICAL RECORD NUMBER [id number]  LOCATION: [facility]  PHYSICIAN: Barbaraann ShareCLANCE,Naureen Benton M  DATE OF STUDY: 12/06/2013  SLEEP STUDY TYPE: Nocturnal Polysomnogram               REFERRING PHYSICIAN: Carla Rashad, Maree KrabbeKeith M, MD  INDICATION FOR STUDY: Hypersomnia with sleep apnea  EPWORTH SLEEPINESS SCORE:  16 HEIGHT: 5\' 11"  (180.3 cm)  WEIGHT: 273 lb (123.832 kg)    Body mass index is 38.09 kg/(m^2).  NECK SIZE: 18 in.  MEDICATIONS: Reviewed in sleep chart  SLEEP ARCHITECTURE: The patient had a total sleep time of 343 minutes with no slow-wave sleep and only 37 minutes of REM. Sleep onset latency was normal at 18 minutes, and REM onset was prolonged.  Sleep efficiency was 76% during the diagnostic portion of the study, and 95% during the titration portion.  RESPIRATORY DATA: The patient underwent a split night study where he was found to have 181 obstructive events in the first 120 minutes of sleep. This gave him an AHI during the diagnostic portion of the study of 91 events per hour. The events occurred in all body positions, and there was loud snoring noted throughout. By protocol, the patient was fitted with a medium Fischer Paykel Simplus full face mask, and found to have an optimal CPAP pressure of 11 cm of water. It should be noted the patient did not reach REM on his final pressure.  OXYGEN DATA: There was oxygen desaturation as low as 81% with the patient's obstructive events  CARDIAC DATA: No clinically significant arrhythmias were noted  MOVEMENT/PARASOMNIA: No significant limb movements or abnormal behaviors were seen.  IMPRESSION/ RECOMMENDATION:    1)  split-night study reveals very severe obstructive sleep apnea, with an AHI of 91 events per hour and oxygen desaturation as low as 81% during the diagnostic portion of the study. The patient was then fitted with a Fisher-Paykel Simplus full face mask, and found to have an  optimal pressure of 11 cm of water. It should be noted that he did not reach REM on his final CPAP pressure. The patient should also be encouraged to work aggressively on weight loss.     Barbaraann ShareLANCE,Hassel Uphoff M Diplomate, American Board of Sleep Medicine  ELECTRONICALLY SIGNED ON:  12/20/2013, 1:31 PM [facility] PH: [phone]   FX: [phone] ACCREDITED BY THE AMERICAN ACADEMY OF SLEEP MEDICINE

## 2014-01-31 ENCOUNTER — Ambulatory Visit: Payer: Self-pay | Admitting: Pulmonary Disease

## 2014-06-26 ENCOUNTER — Ambulatory Visit (INDEPENDENT_AMBULATORY_CARE_PROVIDER_SITE_OTHER): Payer: 59 | Admitting: Family Medicine

## 2014-06-26 ENCOUNTER — Encounter: Payer: Self-pay | Admitting: Family Medicine

## 2014-06-26 VITALS — BP 114/80 | HR 74 | Temp 98.5°F | Ht 71.0 in | Wt 316.0 lb

## 2014-06-26 DIAGNOSIS — R5383 Other fatigue: Secondary | ICD-10-CM

## 2014-06-26 DIAGNOSIS — Z111 Encounter for screening for respiratory tuberculosis: Secondary | ICD-10-CM

## 2014-06-26 DIAGNOSIS — K750 Abscess of liver: Secondary | ICD-10-CM

## 2014-06-26 DIAGNOSIS — R5381 Other malaise: Secondary | ICD-10-CM

## 2014-06-26 LAB — BASIC METABOLIC PANEL
BUN: 17 mg/dL (ref 6–23)
CHLORIDE: 102 meq/L (ref 96–112)
CO2: 25 mEq/L (ref 19–32)
CREATININE: 1 mg/dL (ref 0.4–1.5)
Calcium: 9.6 mg/dL (ref 8.4–10.5)
GFR: 90.09 mL/min (ref >60.00)
Glucose, Bld: 83 mg/dL (ref 70–99)
Potassium: 4.1 mEq/L (ref 3.5–5.1)
Sodium: 136 mEq/L (ref 135–145)

## 2014-06-26 LAB — CBC WITH DIFFERENTIAL/PLATELET
BASOS PCT: 0.6 % (ref 0.0–3.0)
Basophils Absolute: 0 10*3/uL (ref 0.0–0.1)
EOS ABS: 0.2 10*3/uL (ref 0.0–0.7)
Eosinophils Relative: 2.3 % (ref 0.0–5.0)
HCT: 46.1 % (ref 39.0–52.0)
HEMOGLOBIN: 15.9 g/dL (ref 13.0–17.0)
LYMPHS ABS: 3.7 10*3/uL (ref 0.7–4.0)
Lymphocytes Relative: 43.9 % (ref 12.0–46.0)
MCHC: 34.4 g/dL (ref 30.0–36.0)
MCV: 90.4 fl (ref 78.0–100.0)
MONO ABS: 0.9 10*3/uL (ref 0.1–1.0)
Monocytes Relative: 10.3 % (ref 3.0–12.0)
NEUTROS ABS: 3.6 10*3/uL (ref 1.4–7.7)
Neutrophils Relative %: 42.9 % — ABNORMAL LOW (ref 43.0–77.0)
Platelets: 217 10*3/uL (ref 150.0–400.0)
RBC: 5.11 Mil/uL (ref 4.22–5.81)
RDW: 13.6 % (ref 11.5–15.5)
WBC: 8.3 10*3/uL (ref 4.0–10.5)

## 2014-06-26 LAB — HEPATIC FUNCTION PANEL
ALBUMIN: 4.4 g/dL (ref 3.5–5.2)
ALT: 45 U/L (ref 0–53)
AST: 30 U/L (ref 0–37)
Alkaline Phosphatase: 86 U/L (ref 39–117)
Bilirubin, Direct: 0 mg/dL (ref 0.0–0.3)
TOTAL PROTEIN: 7.7 g/dL (ref 6.0–8.3)
Total Bilirubin: 0.8 mg/dL (ref 0.2–1.2)

## 2014-06-26 NOTE — Progress Notes (Signed)
   Dr. Karleen Hampshire T. Harutyun Monteverde, MD, CAQ Sports Medicine Primary [facility] and Sports Medicine [address] [place], [address] Phone: [phone] Fax: [phone]  06/26/2014  Patient: Nicholas Bradford, MRN: [id number], DOB: 20-Dec-1975, 38 y.o.  Primary Physician:  Hannah Beat, MD  Chief Complaint: Form for Teaching and Follow-up  Subjective:   [name] is a 38 y.o. very pleasant male patient who presents with the following:  No pain or vomitting, stomach and IBS issues are doing much better.   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 He is basically doing well and he doesn't really have any complaints at all of them having somefatigue and tiredness.  Feels better than a long time.  Using CPAP now. Feeling better.   Tdap - up to date.   Past Medical History, Surgical History, Social History, Family History, Problem List, Medications, and Allergies have been reviewed and updated if relevant.   GEN: No acute illnesses, no fevers, chills. GI: No n/v/d, eating normally Pulm: No SOB Interactive and getting along well at home.  Otherwise, ROS is as per the HPI.  Objective:   BP 114/80  Pulse 74  Temp(Src) 98.5 F (36.9 C) (Oral)  Ht  (1.803 m)  Wt 316 lb (143.337 kg)  BMI 44.09 kg/m2  GEN: WDWN, NAD, Non-toxic, A & O x 3 HEENT: Atraumatic, Normocephalic. Neck supple. No masses, No LAD. Ears and Nose: No external deformity. CV: RRR, No M/G/R. No JVD. No thrill. No extra heart sounds. PULM: CTA B, no wheezes, crackles, rhonchi. No retractions. No resp. distress. No accessory muscle use. EXTR: No c/c/e NEURO Normal gait.  PSYCH: Normally interactive. Conversant. Not depressed or anxious appearing.  Calm demeanor.   Laboratory and Imaging Data:  Assessment and Plan:   Liver abscess - Plan: Hepatic function panel  Other  malaise and fatigue - Plan: Basic metabolic panel, CBC with Differential  Screening examination for pulmonary tuberculosis - Plan: PPD  Doing well, check basic labs in place PPD. Paperwork completed.  Follow-up: No Follow-up on file.  New Prescriptions   No medications on file   Orders Placed This Encounter  Procedures  . Basic metabolic panel  . CBC with Differential  . Hepatic function panel  . PPD    Signed,  Elpidio Galea. Akosua Constantine, MD   Patient's Medications  New Prescriptions   No medications on file  Previous Medications   DESVENLAFAXINE (PRISTIQ) 50 MG 24 HR TABLET    Take 50 mg by mouth daily.   DIAZEPAM (VALIUM) 10 MG TABLET    Take 10 mg by mouth every 6 (six) hours as needed for anxiety.   MULTIPLE VITAMIN (MULTIVITAMIN WITH MINERALS) TABS TABLET    Take 1 tablet by mouth daily.  Modified Medications   No medications on file  Discontinued Medications   CLONAZEPAM (KLONOPIN) 0.5 MG TABLET    Take 0.5 mg by mouth 3 (three) times daily as needed for anxiety.    ESOMEPRAZOLE (NEXIUM) 20 MG CAPSULE    Take 20 mg by mouth daily at 12 noon.   POLYETHYLENE GLYCOL (MIRALAX / GLYCOLAX) PACKET    Take 17 g by mouth daily as needed for mild constipation or moderate constipation.

## 2014-06-26 NOTE — Progress Notes (Signed)
Pre visit review using our clinic review tool, if applicable. No additional management support is needed unless otherwise documented below in the visit note. 

## 2014-06-27 ENCOUNTER — Encounter: Payer: Self-pay | Admitting: *Deleted

## 2014-06-28 LAB — TB SKIN TEST: TB Skin Test: NEGATIVE

## 2016-07-04 ENCOUNTER — Ambulatory Visit (INDEPENDENT_AMBULATORY_CARE_PROVIDER_SITE_OTHER): Payer: Managed Care, Other (non HMO) | Admitting: Family Medicine

## 2016-07-04 ENCOUNTER — Ambulatory Visit (INDEPENDENT_AMBULATORY_CARE_PROVIDER_SITE_OTHER): Payer: Managed Care, Other (non HMO)

## 2016-07-04 ENCOUNTER — Ambulatory Visit
Admission: RE | Admit: 2016-07-04 | Discharge: 2016-07-04 | Disposition: A | Discharge status: Home / Self Care | Payer: Managed Care, Other (non HMO) | Source: Ambulatory Visit | Attending: Family Medicine | Admitting: Family Medicine

## 2016-07-04 VITALS — BP 116/70 | HR 70 | Temp 98.1°F | Resp 16 | Ht 71.0 in | Wt 283.0 lb

## 2016-07-04 DIAGNOSIS — R1010 Upper abdominal pain, unspecified: Secondary | ICD-10-CM

## 2016-07-04 LAB — COMPLETE METABOLIC PANEL WITH GFR
ALK PHOS: 72 U/L (ref 40–115)
ALT: 19 U/L (ref 9–46)
AST: 17 U/L (ref 10–40)
Albumin: 4 g/dL (ref 3.6–5.1)
BUN: 16 mg/dL (ref 7–25)
CALCIUM: 9.5 mg/dL (ref 8.6–10.3)
CHLORIDE: 105 mmol/L (ref 98–110)
CO2: 28 mmol/L (ref 20–31)
Creat: 0.9 mg/dL (ref 0.60–1.35)
GFR, Est Non African American: >89 mL/min (ref >=60)
GLUCOSE: 92 mg/dL (ref 65–99)
POTASSIUM: 4.5 mmol/L (ref 3.5–5.3)
Sodium: 140 mmol/L (ref 135–146)
TOTAL PROTEIN: 6.5 g/dL (ref 6.1–8.1)
Total Bilirubin: 0.6 mg/dL (ref 0.2–1.2)

## 2016-07-04 LAB — POCT CBC
Granulocyte percent: 67.3 %G (ref 37–80)
HEMATOCRIT: 43.9 % (ref 43.5–53.7)
Hemoglobin: 15.5 g/dL (ref 14.1–18.1)
LYMPH, POC: 2.3 (ref 0.6–3.4)
MCH, POC: 31.1 pg (ref 27–31.2)
MCHC: 35.4 g/dL (ref 31.8–35.4)
MCV: 87.7 fL (ref 80–97)
MID (CBC): 0.5 (ref 0–0.9)
MPV: 7.7 fL (ref 0–99.8)
POC GRANULOCYTE: 5.8 (ref 2–6.9)
POC LYMPH %: 26.4 % (ref 10–50)
POC MID %: 6.3 %M (ref 0–12)
Platelet Count, POC: 214 10*3/uL (ref 142–424)
RBC: 5 M/uL (ref 4.69–6.13)
RDW, POC: 12.8 %
WBC: 8.6 10*3/uL (ref 4.6–10.2)

## 2016-07-04 MED ORDER — ONDANSETRON 8 MG PO TBDP: 8.0000 mg | ORAL_TABLET | Freq: Three times a day (TID) | ORAL | 0 refills | Status: DC | PRN

## 2016-07-04 MED ORDER — POLYETHYLENE GLYCOL 3350 17 GM/SCOOP PO POWD: 17.0000 g | Freq: Two times a day (BID) | ORAL | 1 refills | Status: DC | PRN

## 2016-07-04 MED ORDER — IOPAMIDOL (ISOVUE-300) INJECTION 61%
125.0000 mL | Freq: Once | INTRAVENOUS | Status: AC | PRN
Start: 2016-07-04 — End: 2016-07-04
  Administered 2016-07-04: 125 mL via INTRAVENOUS

## 2016-07-04 MED ORDER — RANITIDINE HCL 150 MG PO TABS: 150.0000 mg | ORAL_TABLET | Freq: Two times a day (BID) | ORAL | 0 refills | Status: DC

## 2016-07-04 NOTE — Progress Notes (Signed)
Patient ID: Nicholas Bradford, male    DOB: September 16, 1976, 40 y.o.   MRN: [id number]  PCP: Hannah Beat, MD  Chief Complaint  Patient presents with  . 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  He has history of IBS and in 2015 he was hospitalized for liver abscess. He presents today describing what he experiences as multiple daily abdominal attacks occurring frequently throughout the day.  He describes the episodes as intense cramping, with nausea, and occasional vomiting. He also has to defecate as soon as the abdominal pain subsides. Reports stools are formed, rather thicker than normal, no tarry or bloody color. He is having multiple stools per day. Last episode of vomiting occurred Tuesday. He denies known fever. Feels fatigue. Very concerned that this pain may be related to his liver.  He reports his appendix was removed several years ago and he still has gallbladder.   Social History   Social History  . Marital status: Divorced    Spouse name: N/A  . Number of children: 0  . Years of education: N/A   Occupational History  . unemployeed [employer]   Social History Main Topics  . Smoking status: Former Smoker    Packs/day: 0.25    Years: 0.30    Types: Cigarettes    Quit date: 09/30/1995  . Smokeless tobacco: Never Used     Comment: Pt only smoked x 3-4 months total. 2nd hand smoke exposure x 30+years.  . Alcohol use No  . Drug use: No  . Sexual activity: Yes    Partners: Female   Other Topics Concern  . Not on file   Social History Narrative   Now unemployed    Married   Review of Systems See HPI  Patient Active Problem List   Diagnosis Date Noted  . Anal fissure 08/22/2013  . Liver abscess 08/20/2013  . Internal hemorrhoids with other complication 08/08/2013  . Schizoaffective disorder (HCC) 07/01/2013  . GERD (gastroesophageal reflux disease)  07/01/2013  . Morbid obesity (HCC) 07/01/2013  . Obstructive sleep apnea 07/01/2013  . Irritable bowel syndrome (IBS)      Prior to Admission medications   Medication Sig Start Date End Date Taking? Authorizing Provider  Multiple Vitamin (MULTIVITAMIN WITH MINERALS) TABS tablet Take 1 tablet by mouth daily.   Yes Historical Provider, MD    Allergies  Allergen Reactions  . Flagyl [Metronidazole] Nausea Only  . Rocephin [Ceftriaxone] Nausea And Vomiting and Rash       Objective:  Physical Exam  Constitutional: He is oriented to person, place, and time. He appears well-developed and well-nourished.  HENT:  Head: Normocephalic and atraumatic.  Eyes: Conjunctivae are normal. Pupils are equal, round, and reactive to light.  Neck: Normal range of motion.  Cardiovascular: Normal rate, regular rhythm, normal heart sounds and intact distal pulses.   Pulmonary/Chest: Effort normal.  Abdominal: Soft. He exhibits no distension and no mass. There is tenderness. There is no rebound and no guarding.  Tenderness with deep palpation in right upper, mid lower quadrant, and left lower quadrant. Liver non-palpable. Negative abdominal pulsations.  Musculoskeletal: Normal range of motion.  Neurological: He is alert and oriented to person, place, and time.  Skin: Skin is warm and dry.   Vitals:   07/04/16 1029  BP: 116/70  Pulse: 70  Resp: 16  Temp: 98.1 F (  36.7 C)     Assessment & Plan:  1. Pain of upper abdomen, - COMPLETE METABOLIC PANEL WITH GFR - POCT CBC - DG Abd 2 Views - CT Abdomen Pelvis W Contrast   40 year old males, presents with atypical abdominal cramping that has persisted consistently for 2 week with episodes of nausea with vomiting. After review of prior abdominal studies, sigmoidoscopy report indicates patient has diverticulitis. He also still has his gallbladder and has a history of liver abscess. Due to the duration of symptoms and tenderness present on exam,  abdominal CT indicated to rule out diverticulitis or any acute process involving gallbladder.   Plan: CT scan was negative of any acute findings. Treat symptoms of nausea, diarrhea, and abdominal cramping.  . polyethylene glycol powder (GLYCOLAX/MIRALAX) powder    Sig: Take 17 g by mouth 2 (two) times daily as needed. Until stool is less thicken and coarse.  . ranitidine (ZANTAC) 150 MG tablet    Sig: Take 1 tablet (150 mg total) by mouth 2 (two) times daily.  . ondansetron (ZOFRAN-ODT) 8 MG disintegrating tablet    Sig: Take 1 tablet (8 mg total) by mouth every 8 (eight) hours as needed for nausea.   Ambulatory referral to Gastroenterology  Follow-up as needed.  Godfrey PickKimberly S. Tiburcio PeaHarris, MSN, FNP-C [facility]

## 2016-07-04 NOTE — Patient Instructions (Addendum)
YOU HAVE AN APPOINTMENT TODAY AT [facility]. ARRIVE AT 4;10 FOR A 4;30 APPOINTMENT. ADDRESS IS [address] IN [place].    IF you received an x-ray today, you will receive an invoice from [facility]. Please contact [facility] at [phone] with questions or concerns regarding your invoice.   IF you received labwork today, you will receive an invoice from [facility]. Please contact [facility] at [phone] with questions or concerns regarding your invoice.   Our billing staff will not be able to assist you with questions regarding bills from these companies.  You will be contacted with the lab results as soon as they are available. The fastest way to get your results is to activate your My Chart account. Instructions are located on the last page of this paperwork. If you have not heard from us regarding the results in 2 weeks, please contact this office.

## 2016-07-05 ENCOUNTER — Encounter: Payer: Self-pay | Admitting: Family Medicine

## 2016-08-13 ENCOUNTER — Telehealth: Payer: Self-pay

## 2016-08-13 NOTE — Telephone Encounter (Signed)
We referred pt to [facility]. They have called him three times but vm is not set up. If he would like to make an apt with them, he is welcome to call their office to schedule.

## 2016-11-27 ENCOUNTER — Ambulatory Visit (HOSPITAL_COMMUNITY)
Admission: RE | Admit: 2016-11-27 | Discharge: 2016-11-27 | Disposition: A | Discharge status: Home / Self Care | Payer: Managed Care, Other (non HMO) | Attending: Psychiatry | Admitting: Psychiatry
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
## 2016-11-27 NOTE — H&P (Signed)
Behavioral Health Medical Screening Exam  Lucilla Lamenthony Ku is an 41 y.o. male.  Total Time spent with patient: 20 minutes  Psychiatric Specialty Exam: Physical Exam  Constitutional: He is oriented to person, place, and time. He appears well-developed and well-nourished.  HENT:  Head: Normocephalic and atraumatic.  Right Ear: External ear normal.  Left Ear: External ear normal.  Neck: Normal range of motion.  Cardiovascular: Normal rate and normal heart sounds.   Respiratory: Effort normal and breath sounds normal.  GI: Soft. Bowel sounds are normal.  Musculoskeletal: Normal range of motion.  Neurological: He is alert and oriented to person, place, and time.  Skin: Skin is warm and dry.    Review of Systems  Psychiatric/Behavioral: Positive for depression. Negative for hallucinations, memory loss, substance abuse and suicidal ideas. The patient is not nervous/anxious and does not have insomnia.   All other systems reviewed and are negative.   Blood pressure 136/88, pulse 82, temperature 99 F (37.2 C), temperature source Oral, resp. rate 18, SpO2 98 %.There is no height or weight on file to calculate BMI.  General Appearance: Casual  Eye Contact:  Good  Speech:  Clear and Coherent and Normal Rate  Volume:  Normal  Mood:  Depressed  Affect:  Congruent, Depressed and Tearful  Thought Process:  Coherent, Goal Directed and Linear  Orientation:  Full (Time, Place, and Person)  Thought Content:  Logical  Suicidal Thoughts:  No  Homicidal Thoughts:  No  Memory:  Immediate;   Good Recent;   Good Remote;   Fair  Judgement:  Good  Insight:  Good  Psychomotor Activity:  Normal  Concentration: Concentration: Good and Attention Span: Good  Recall:  Good  Fund of Knowledge:Good  Language: Good  Akathisia:  No  Handed:  Right  AIMS (if indicated):     Assets:  Communication Skills Desire for Improvement Financial Resources/Insurance Housing Physical Health Resilience Social  Support Vocational/Educational  Sleep:       Musculoskeletal: Strength & Muscle Tone: within normal limits Gait & Station: normal Patient leans: N/A  Blood pressure 136/88, pulse 82, temperature 99 F (37.2 C), temperature source Oral, resp. rate 18, SpO2 98 %.  Recommendations:  Based on my evaluation the patient does not appear to have an emergency medical condition.  Laveda AbbeLaurie Britton Parks, NP 11/27/2016, 12:59 PM

## 2016-11-28 ENCOUNTER — Ambulatory Visit (INDEPENDENT_AMBULATORY_CARE_PROVIDER_SITE_OTHER): Payer: 59 | Admitting: Psychiatry

## 2016-11-28 ENCOUNTER — Encounter (HOSPITAL_COMMUNITY): Payer: Self-pay | Admitting: Psychiatry

## 2016-11-28 VITALS — BP 130/78 | HR 82 | Ht 71.0 in | Wt 279.8 lb

## 2016-11-28 DIAGNOSIS — Z79899 Other long term (current) drug therapy: Secondary | ICD-10-CM

## 2016-11-28 DIAGNOSIS — Z87891 Personal history of nicotine dependence: Secondary | ICD-10-CM

## 2016-11-28 DIAGNOSIS — F331 Major depressive disorder, recurrent, moderate: Secondary | ICD-10-CM

## 2016-11-28 DIAGNOSIS — R45851 Suicidal ideations: Secondary | ICD-10-CM

## 2016-11-28 DIAGNOSIS — Z811 Family history of alcohol abuse and dependence: Secondary | ICD-10-CM | POA: Diagnosis not present

## 2016-11-28 MED ORDER — BUPROPION HCL ER (XL) 150 MG PO TB24: 150.0000 mg | ORAL_TABLET | ORAL | 2 refills | Status: DC

## 2016-11-28 MED ORDER — ALPRAZOLAM 0.25 MG PO TABS: 0.2500 mg | ORAL_TABLET | Freq: Every day | ORAL | 0 refills | Status: DC | PRN
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
## 2016-12-04 ENCOUNTER — Ambulatory Visit (INDEPENDENT_AMBULATORY_CARE_PROVIDER_SITE_OTHER): Payer: 59 | Admitting: Psychiatry

## 2016-12-04 DIAGNOSIS — Z79899 Other long term (current) drug therapy: Secondary | ICD-10-CM | POA: Diagnosis not present

## 2016-12-04 DIAGNOSIS — Z811 Family history of alcohol abuse and dependence: Secondary | ICD-10-CM | POA: Diagnosis not present

## 2016-12-04 DIAGNOSIS — Z87891 Personal history of nicotine dependence: Secondary | ICD-10-CM

## 2016-12-04 DIAGNOSIS — F331 Major depressive disorder, recurrent, moderate: Secondary | ICD-10-CM | POA: Diagnosis not present

## 2016-12-04 MED ORDER — ALPRAZOLAM 0.25 MG PO TABS: 0.5000 mg | ORAL_TABLET | Freq: Every day | ORAL | 0 refills | Status: DC | PRN

## 2016-12-04 NOTE — Progress Notes (Signed)
BH MD/PA/NP OP Progress Note  12/04/2016 9:41 AM Nicholas Bradford  MRN:  [id number]  Chief Complaint: some anxiety, but mood better, improving Subjective:  Patient reports that he has tolerated Wellbutrin 150 mg XL without any issue. He has had mild headaches, but this is beginning to dissipate. He reports that he has noted his move is brighter, he is looking forward to the day more, and he notes that he does not want to be dead, and feels more hopeful in life. He reports that his work stressors continue, particularly a negative relationship with his Public house managerDean. We discussed some behavioral ways to combat this anxiety and frustration. He was very appreciative and plans to implement these techniques. He reports that he is sleeping well at night. He denies any suicidal thoughts. He denies any substance abuse. 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 He is looking for a support group in the community, and we discussed some resources to help him find this. He is going to be arranging an individual therapist through [facility], as this would be covered for free through his health insurance. He agrees to follow-up with this Clinical research associatewriter in 3-4 weeks for medication check.  Visit Diagnosis:    ICD-9-CM ICD-10-CM   1. Moderate episode of recurrent major depressive disorder (HCC) 296.32 F33.1 ALPRAZolam (XANAX) 0.25 MG tablet    Past Psychiatric History: See intake H&P for full details. Reviewed, with no updates at this time.   Past Medical History:  Past Medical History:  Diagnosis Date  . Anxiety   . Depression   . GERD (gastroesophageal reflux disease)   . Irritable bowel syndrome (IBS)   . Liver abscess   . Morbid obesity (HCC) 07/01/2013  . Obstructive sleep apnea 07/01/2013  . PTSD (post-traumatic stress disorder)     Past Surgical History:  Procedure Laterality Date  . APPENDECTOMY  1990  .  LIVER SURGERY     has a pick line, tube for liver abscess    Family Psychiatric History: See intake H&P for full details. Reviewed, with no updates at this time.   Family History:  Family History  Problem Relation Age of Onset  . Breast cancer Mother   . Diabetes Mother   . Alcohol abuse Father   . Cirrhosis Father     alcohol related  . Heart disease Paternal Grandfather   . Colon cancer Neg Hx   . Kidney disease Neg Hx     Social History:  Social History   Social History  . Marital status: Divorced    Spouse name: N/A  . Number of children: 0  . Years of education: N/A   Occupational History  . unemployeed [employer]   Social History Main Topics  . Smoking status: Former Smoker    Packs/day: 0.25    Years: 0.30    Types: Cigarettes    Quit date: 09/30/1995  . Smokeless tobacco: Never Used     Comment: Pt only smoked x 3-4 months total. 2nd hand smoke exposure x 30+years.  . Alcohol use No  . Drug use: No  . Sexual activity: Yes    Partners: Female   Other Topics Concern  . Not on file   Social History Narrative   Now unemployed    Married    Allergies:  Allergies  Allergen Reactions  . Flagyl [Metronidazole] Nausea Only  .  Rocephin [Ceftriaxone] Nausea And Vomiting and Rash    Metabolic Disorder Labs: No results found for: HGBA1C, MPG No results found for: PROLACTIN No results found for: CHOL, TRIG, HDL, CHOLHDL, VLDL, LDLCALC   Current Medications: Current Outpatient Prescriptions  Medication Sig Dispense Refill  . ALPRAZolam (XANAX) 0.25 MG tablet Take 2 tablets (0.5 mg total) by mouth daily as needed for anxiety. 30 tablet 0  . buPROPion (WELLBUTRIN XL) 150 MG 24 hr tablet Take 1 tablet (150 mg total) by mouth every morning. 30 tablet 2  . Multiple Vitamin (MULTIVITAMIN WITH MINERALS) TABS tablet Take 1 tablet by mouth daily.     No current facility-administered medications for this visit.     Neurologic: Headache:  Negative Seizure: Negative Paresthesias: Negative  Musculoskeletal: Strength & Muscle Tone: within normal limits Gait & Station: normal Patient leans: N/A  Psychiatric Specialty Exam: Review of Systems  Constitutional: Negative.   HENT: Negative.   Respiratory: Negative.   Cardiovascular: Negative.   Gastrointestinal: Negative.   Musculoskeletal: Negative.   Skin: Negative.   Neurological: Positive for headaches.  Psychiatric/Behavioral: The patient is nervous/anxious.     There were no vitals taken for this visit.There is no height or weight on file to calculate BMI.  General Appearance: Fairly Groomed  Eye Contact:  Good  Speech:  Clear and Coherent  Volume:  Normal  Mood:  Euthymic  Affect:  Congruent  Thought Process:  Coherent  Orientation:  Full (Time, Place, and Person)  Thought Content: Logical   Suicidal Thoughts:  No  Homicidal Thoughts:  No  Memory:  Immediate;   Good  Judgement:  Good  Insight:  Good  Psychomotor Activity:  Normal  Concentration:  Concentration: Good and Attention Span: Good  Recall:  Good  Fund of Knowledge: NA  Language: Good  Akathisia:  Negative  Handed:  Right  AIMS (if indicated):  n/a  Assets:  Communication Skills Desire for Improvement Financial Resources/Insurance Housing Intimacy Leisure Time Physical Health Resilience Social Support Talents/Skills Transportation Vocational/Educational  ADL's:  Intact  Cognition: WNL  Sleep:  7-8 hours    Treatment Plan Summary:  Lathyn Griggs is a 41 year old male with a history of major depressive disorder, with anxious features. He has a family history of bipolar affective disorder, and he has had equivocal results to SSRI in the past. He has had a better response to Wellbutrin, in terms of brightening mood and motivation. He is using Xanax 0.25-0.5 mg when necessary for anxiety or panic, and generally uses this once a day during the weekday. He has no acute safety issues at this  time and will follow-up with writer in 3-4 weeks.  Major depressive disorder with anxious features - Continue Wellbutrin 150 mg XL - Xanax 0.25-0.5 mg daily when necessary - Follow-up in 3-4 weeks - Discussed community supports in individual therapy, and the patient will be working on arranging this in the coming weeks  Burnard Leigh, MD 12/04/2016, 9:41 AM

## 2016-12-29 ENCOUNTER — Encounter (HOSPITAL_COMMUNITY): Payer: Self-pay | Admitting: Psychiatry

## 2016-12-29 ENCOUNTER — Ambulatory Visit (INDEPENDENT_AMBULATORY_CARE_PROVIDER_SITE_OTHER): Payer: 59 | Admitting: Psychiatry

## 2016-12-29 VITALS — BP 126/80 | HR 87 | Ht 71.0 in | Wt 280.4 lb

## 2016-12-29 DIAGNOSIS — Z87891 Personal history of nicotine dependence: Secondary | ICD-10-CM | POA: Diagnosis not present

## 2016-12-29 DIAGNOSIS — Z811 Family history of alcohol abuse and dependence: Secondary | ICD-10-CM | POA: Diagnosis not present

## 2016-12-29 DIAGNOSIS — Z888 Allergy status to other drugs, medicaments and biological substances status: Secondary | ICD-10-CM | POA: Diagnosis not present

## 2016-12-29 DIAGNOSIS — Z79899 Other long term (current) drug therapy: Secondary | ICD-10-CM | POA: Diagnosis not present

## 2016-12-29 DIAGNOSIS — F331 Major depressive disorder, recurrent, moderate: Secondary | ICD-10-CM

## 2016-12-29 MED ORDER — ALPRAZOLAM 0.25 MG PO TABS: 0.5000 mg | ORAL_TABLET | Freq: Every day | ORAL | 2 refills | Status: DC | PRN

## 2016-12-29 MED ORDER — BUPROPION HCL ER (XL) 150 MG PO TB24: 150.0000 mg | ORAL_TABLET | ORAL | 1 refills | Status: AC

## 2016-12-29 NOTE — Progress Notes (Signed)
BH MD/PA/NP OP Progress Note  12/29/2016 8:30 AM Nicholas Bradford  MRN:  [id number]  Chief Complaint: med follow up Subjective:  Patient reports that he is doing quite well with the current medication regimen. He reports that his meetings at school who have went well, and he has been able to maintain his mood in the face of ongoing stressors portes that he is sleeping generally pretty well, except for some intermittent awakenings from snoring. Spent time educating the patient about sleep apnea and the interplay with depression and treatment resistance, and he agrees to contact his sleep doctor today to get a sleep apnea machine piece replaced. He denies any suicidal thoughts. He is generally waking up more hopeful. He uses Xanax generally once a day as needed, but not on the weekends, and not this week given that he is on spring break. His anxiety and stress tends to be surrounding school. He feels very hopeful about the future, and expresses gratitude for the care.  Visit Diagnosis:    ICD-9-CM ICD-10-CM   1. Moderate episode of recurrent major depressive disorder (HCC) 296.32 F33.1 buPROPion (WELLBUTRIN XL) 150 MG 24 hr tablet     ALPRAZolam (XANAX) 0.25 MG tablet   Past Psychiatric History: See intake H&P for full details. Reviewed, with no updates at this time.  Past Medical History:  Past Medical History:  Diagnosis Date  . Anxiety   . Depression   . GERD (gastroesophageal reflux disease)   . Irritable bowel syndrome (IBS)   . Liver abscess   . Morbid obesity (HCC) 07/01/2013  . Obstructive sleep apnea 07/01/2013  . PTSD (post-traumatic stress disorder)     Past Surgical History:  Procedure Laterality Date  . APPENDECTOMY  1990  . LIVER SURGERY     has a pick line, tube for liver abscess    Family Psychiatric History: See intake H&P for full details. Reviewed, with no updates at this time.   Family History:  Family History  Problem Relation Age of Onset  . Breast cancer Mother    . Diabetes Mother   . Alcohol abuse Father   . Cirrhosis Father     alcohol related  . Heart disease Paternal Grandfather   . Colon cancer Neg Hx   . Kidney disease Neg Hx     Social History:  Social History   Social History  . Marital status: Divorced    Spouse name: N/A  . Number of children: 0  . Years of education: N/A   Occupational History  . unemployeed [employer]   Social History Main Topics  . Smoking status: Former Smoker    Packs/day: 0.25    Years: 0.30    Types: Cigarettes    Quit date: 09/30/1995  . Smokeless tobacco: Never Used     Comment: Pt only smoked x 3-4 months total. 2nd hand smoke exposure x 30+years.  . Alcohol use No  . Drug use: No  . Sexual activity: Yes    Partners: Female   Other Topics Concern  . None   Social History Narrative   Now unemployed    Married    Allergies:  Allergies  Allergen Reactions  . Flagyl [Metronidazole] Nausea Only  . Rocephin [Ceftriaxone] Nausea And Vomiting and Rash    Metabolic Disorder Labs: No results found for: HGBA1C, MPG No results found for: PROLACTIN No results found for: CHOL, TRIG, HDL, CHOLHDL, VLDL, LDLCALC   Current Medications: Current Outpatient Prescriptions  Medication Sig Dispense Refill  .  ALPRAZolam (XANAX) 0.25 MG tablet Take 2 tablets (0.5 mg total) by mouth daily as needed for anxiety. 30 tablet 2  . buPROPion (WELLBUTRIN XL) 150 MG 24 hr tablet Take 1 tablet (150 mg total) by mouth every morning. 90 tablet 1  . Multiple Vitamin (MULTIVITAMIN WITH MINERALS) TABS tablet Take 1 tablet by mouth daily.     No current facility-administered medications for this visit.    Neurologic: Headache: Negative Seizure: Negative Paresthesias: Negative  Musculoskeletal: Strength & Muscle Tone: within normal limits Gait & Station: normal Patient leans: N/A  Psychiatric Specialty Exam: Review of Systems  Constitutional: Negative.   HENT: Negative.   Respiratory:  Negative.   Cardiovascular: Negative.   Gastrointestinal: Negative.   Musculoskeletal: Negative.   Skin: Negative.   Psychiatric/Behavioral: The patient is nervous/anxious.     Blood pressure 126/80, pulse 87, height  (1.803 m), weight 280 lb 6.4 oz (127.2 kg).Body mass index is 39.11 kg/m.  General Appearance: Well Groomed  Eye Contact:  Good  Speech:  Clear and Coherent  Volume:  Normal  Mood:  Euthymic and cheerful, hopeful  Affect:  Congruent  Thought Process:  Coherent  Orientation:  Full (Time, Place, and Person)  Thought Content: Logical   Suicidal Thoughts:  No  Homicidal Thoughts:  No  Memory:  Immediate;   Good  Judgement:  Good  Insight:  Good  Psychomotor Activity:  Normal  Concentration:  Concentration: Good and Attention Span: Good  Recall:  Good  Fund of Knowledge: NA  Language: Good  Akathisia:  Negative  Handed:  Right  AIMS (if indicated):  n/a  Assets:  Communication Skills Desire for Improvement Financial Resources/Insurance Housing Intimacy Leisure Time Physical Health Resilience Social Support Talents/Skills Transportation Vocational/Educational  ADL's:  Intact  Cognition: WNL  Sleep:  8 hours    Treatment Plan Summary:  Justun Anaya is a 41 year old male with a history of major depressive disorder, with anxious features. He has a family history of bipolar affective disorder, and he has had equivocal results to SSRI in the past. Excellent response to Wellbutrin with 75+ percent remission of symptoms, and using Xanax only on an as-needed basis related to work stressors. No acute safety issues, and is hopeful for the future. We'll follow-up in 3 months.  1. Moderate episode of recurrent major depressive disorder (HCC)    - Continue Wellbutrin 150 mg XL; instructed patient that if he feels the need, or if Wellbutrin's effects plateau, he can increase to 300 mg XL, and let us know via mychart or phone - Xanax 0.25-0.5 mg daily when  necessary - Follow-up in 3 months - Patient will arrange therapy follow-up in this clinic  Burnard Leigh, MD 12/29/2016, 8:30 AM

## 2017-02-16 ENCOUNTER — Ambulatory Visit (HOSPITAL_COMMUNITY): Payer: Self-pay | Admitting: Psychiatry

## 2017-02-17 ENCOUNTER — Ambulatory Visit (INDEPENDENT_AMBULATORY_CARE_PROVIDER_SITE_OTHER): Payer: 59 | Admitting: Psychiatry

## 2017-02-17 ENCOUNTER — Encounter (HOSPITAL_COMMUNITY): Payer: Self-pay | Admitting: Psychiatry

## 2017-02-17 DIAGNOSIS — F331 Major depressive disorder, recurrent, moderate: Secondary | ICD-10-CM

## 2017-02-17 DIAGNOSIS — Z87891 Personal history of nicotine dependence: Secondary | ICD-10-CM

## 2017-02-17 DIAGNOSIS — Z811 Family history of alcohol abuse and dependence: Secondary | ICD-10-CM | POA: Diagnosis not present

## 2017-02-17 MED ORDER — ALPRAZOLAM 0.25 MG PO TABS: 0.5000 mg | ORAL_TABLET | Freq: Every day | ORAL | 2 refills | Status: AC | PRN

## 2017-02-17 NOTE — Progress Notes (Signed)
BH MD/PA/NP OP Progress Note  02/17/2017 10:47 AM Nicholas Bradford  MRN:  [id number]  Chief Complaint: med follow up  Subjective:  Nicholas Bradford presents for follow-up. 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 He shared with me a Facebook post he had made, expressing his grief and sadness that the school has not renewed his contract. The following day, he was told to vacate the premises at school, and given a letter that he would be unpaid leave until further notice. He feels quite upset about this, and discriminated against for having depression and PTSD and anxiety. He reports that he is trying to move forward and think about his future goals including potentially going back to school to get a masters degree.  He has admittedly had some hit to his mood and anxiety with these events.  He denies any SI or HI, and plans to spend the next few weeks reflecting on his future goals.  He continues on Wellbutrin 150 XL, and continues to use Xanax sparingly, generally once or twice a day at most. He agrees to follow-up in 6 weeks as scheduled, and also agrees to engage in therapy in this office.  Visit Diagnosis:    ICD-9-CM ICD-10-CM   1. Moderate episode of recurrent major depressive disorder (HCC) 296.32 F33.1 ALPRAZolam (XANAX) 0.25 MG tablet   Past Psychiatric History: See intake H&P for full details. Reviewed, with no updates at this time.  Past Medical History:  Past Medical History:  Diagnosis Date  . Anxiety   . Depression   . GERD (gastroesophageal reflux disease)   . Irritable bowel syndrome (IBS)   . Liver abscess   . Morbid obesity (HCC) 07/01/2013  . Obstructive sleep apnea 07/01/2013  . PTSD (post-traumatic stress disorder)     Past Surgical History:  Procedure Laterality Date  . APPENDECTOMY  1990  . LIVER SURGERY     has a pick line, tube for liver abscess    Family Psychiatric History: See intake H&P for full details. Reviewed, with no  updates at this time.   Family History:  Family History  Problem Relation Age of Onset  . Breast cancer Mother   . Diabetes Mother   . Alcohol abuse Father   . Cirrhosis Father        alcohol related  . Heart disease Paternal Grandfather   . Colon cancer Neg Hx   . Kidney disease Neg Hx     Social History:  Social History   Social History  . Marital status: Divorced    Spouse name: N/A  . Number of children: 0  . Years of education: N/A   Occupational History  . unemployeed [employer]   Social History Main Topics  . Smoking status: Former Smoker    Packs/day: 0.25    Years: 0.30    Types: Cigarettes    Quit date: 09/30/1995  . Smokeless tobacco: Never Used     Comment: Pt only smoked x 3-4 months total. 2nd hand smoke exposure x 30+years.  . Alcohol use No  . Drug use: No  . Sexual activity: Yes    Partners: Female   Other Topics Concern  . None   Social History Narrative   Now unemployed    Married    Allergies:  Allergies  Allergen Reactions  . Flagyl [Metronidazole] Nausea Only  . Rocephin [Ceftriaxone] Nausea And Vomiting and Rash    Metabolic Disorder Labs: No results  found for: HGBA1C, MPG No results found for: PROLACTIN No results found for: CHOL, TRIG, HDL, CHOLHDL, VLDL, LDLCALC   Current Medications: Current Outpatient Prescriptions  Medication Sig Dispense Refill  . ALPRAZolam (XANAX) 0.25 MG tablet Take 2 tablets (0.5 mg total) by mouth daily as needed for anxiety. 60 tablet 2  . buPROPion (WELLBUTRIN XL) 150 MG 24 hr tablet Take 1 tablet (150 mg total) by mouth every morning. 90 tablet 1  . Multiple Vitamin (MULTIVITAMIN WITH MINERALS) TABS tablet Take 1 tablet by mouth daily.     No current facility-administered medications for this visit.    Neurologic: Headache: Negative Seizure: Negative Paresthesias: Negative  Musculoskeletal: Strength & Muscle Tone: within normal limits Gait & Station: normal Patient leans:  N/A  Psychiatric Specialty Exam: Review of Systems  Constitutional: Negative.   HENT: Negative.   Respiratory: Negative.   Cardiovascular: Negative.   Gastrointestinal: Negative.   Musculoskeletal: Negative.   Skin: Negative.   Psychiatric/Behavioral: The patient is nervous/anxious.     Blood pressure 140/80, pulse 97, height 5\' 11"  (1.803 m), weight 125.6 kg (277 lb).Body mass index is 38.63 kg/m.  General Appearance: Well Groomed  Eye Contact:  Good  Speech:  Clear and Coherent  Volume:  Normal  Mood:  Dysphoric and Euthymic  Affect:  Congruent  Thought Process:  Coherent  Orientation:  Full (Time, Place, and Person)  Thought Content: Logical   Suicidal Thoughts:  No  Homicidal Thoughts:  No  Memory:  Immediate;   Good  Judgement:  Good  Insight:  Good  Psychomotor Activity:  Normal  Concentration:  Concentration: Good and Attention Span: Good  Recall:  Good  Fund of Knowledge: NA  Language: Good  Akathisia:  Negative  Handed:  Right  AIMS (if indicated):  n/a  Assets:  Communication Skills Desire for Improvement Financial Resources/Insurance Housing Intimacy Leisure Time Physical Health Resilience Social Support Talents/Skills Transportation Vocational/Educational  ADL's:  Intact  Cognition: WNL  Sleep:  7-8 hours    Treatment Plan Summary:  Nicholas Bradford is a 41 year old male with a history of major depressive disorder, with anxious features. He has a family history of bipolar affective disorder, and he has had equivocal results to SSRI in the past. Good response to Wellbutrin, with periodic use of Xanax for acute anxiety. He has had ongoing stressors with his job, and unfortunately has felt discriminated against for his struggles with mental health issues. He will follow-up with this writer in 6 weeks and starting therapy in this office. No acute safety issues, and he is future oriented in terms of his goals.  1. Moderate episode of recurrent major  depressive disorder (HCC)    - Continue Wellbutrin 150 mg XL - Xanax 0.25-0.5 mg daily when necessary - Follow-up in 2 months  Burnard LeighAlexander Arya Jadira Nierman, MD 02/17/2017, 10:47 AM

## 2017-02-24 ENCOUNTER — Ambulatory Visit (HOSPITAL_COMMUNITY): Payer: Self-pay | Admitting: Licensed Clinical Social Worker

## 2017-03-05 ENCOUNTER — Ambulatory Visit (HOSPITAL_COMMUNITY): Payer: Self-pay | Admitting: Psychiatry

## 2017-04-09 ENCOUNTER — Ambulatory Visit (HOSPITAL_COMMUNITY): Payer: Self-pay | Admitting: Psychiatry

## 2017-08-27 IMAGING — DX DG ABDOMEN 2V
3 series · 3 of 3 positions shown · non-contrast
Comparison: CT abdomen September 06, 2013

CLINICAL DATA: Abdominal pain for 2 weeks

EXAM:
ABDOMEN - 2 VIEW

[abdomen erect]
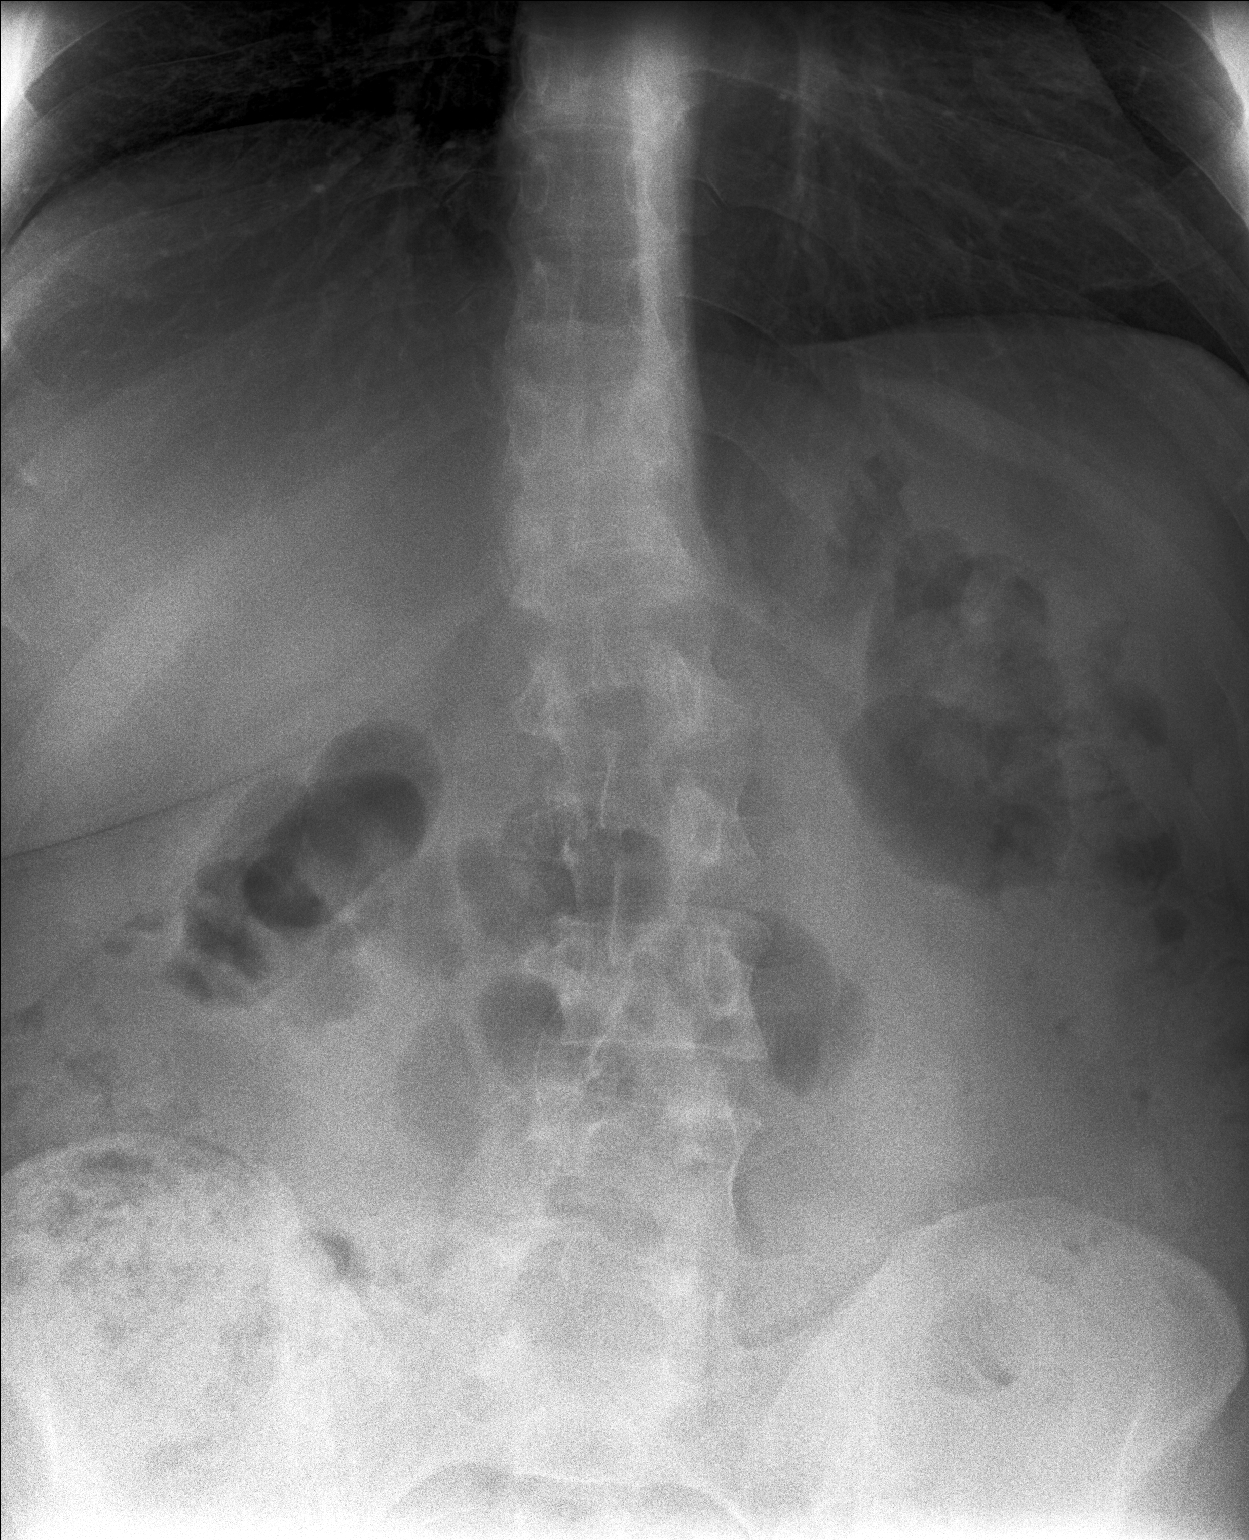

[abdomen supine (1 of 2)]
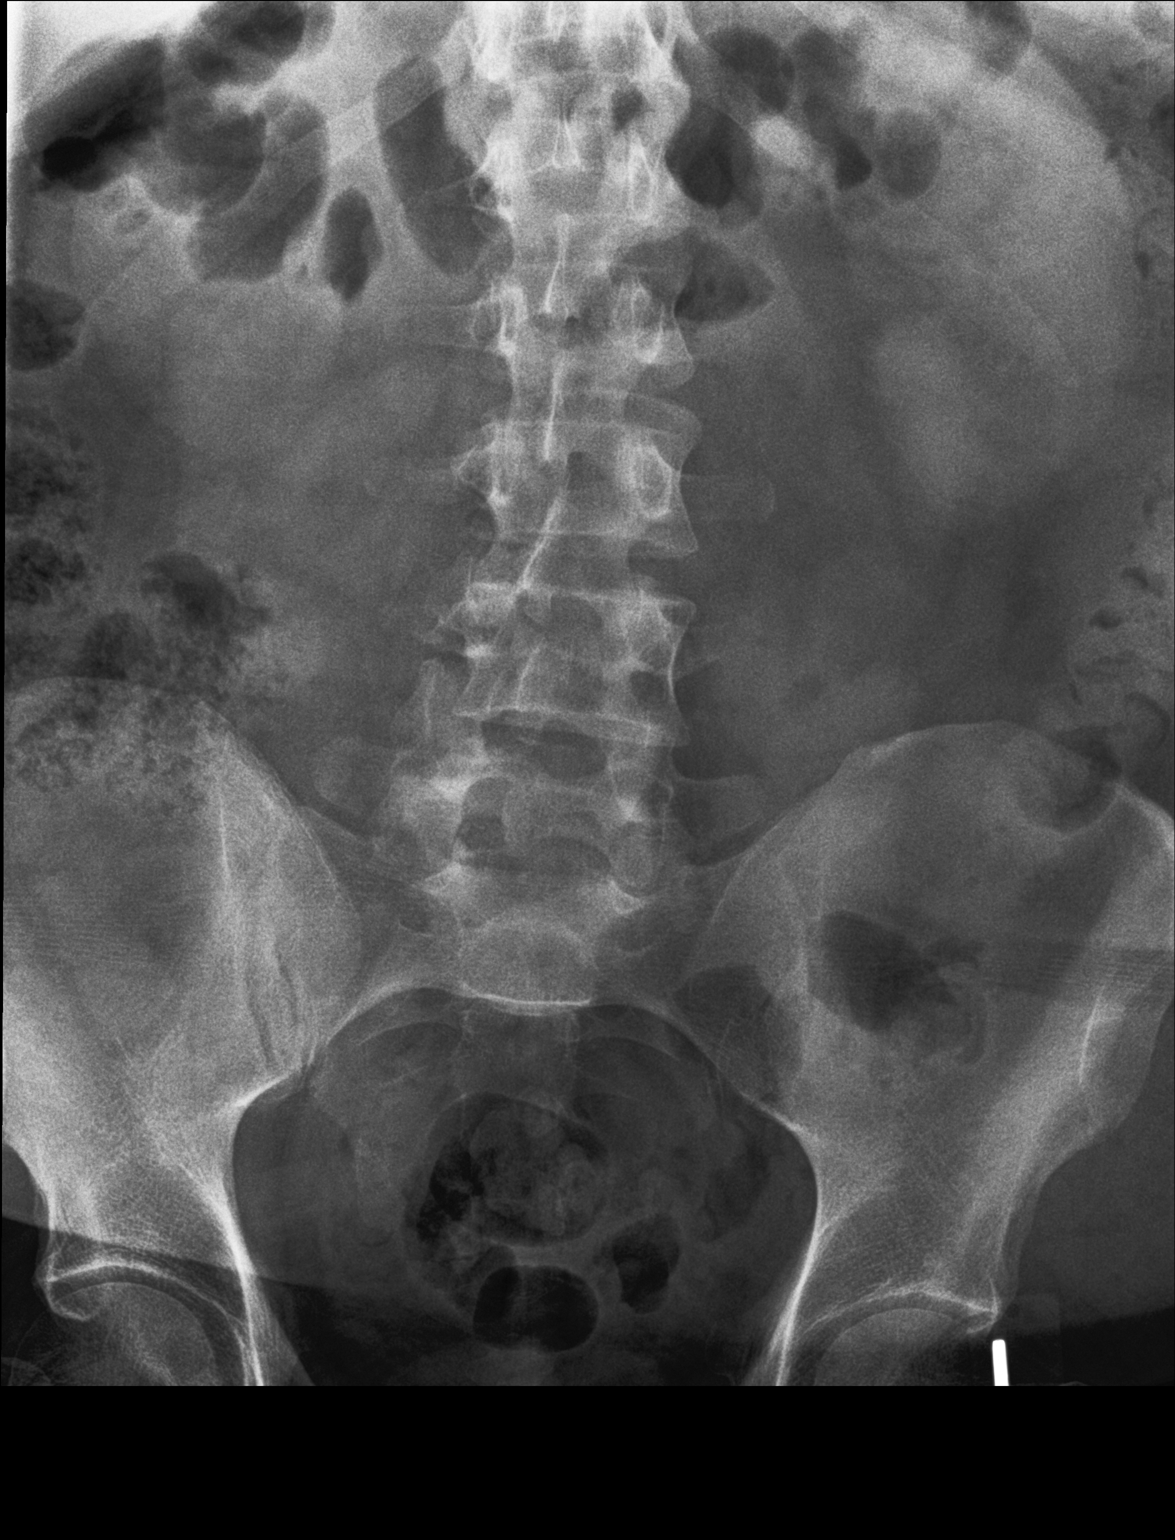

[abdomen supine (2 of 2)]
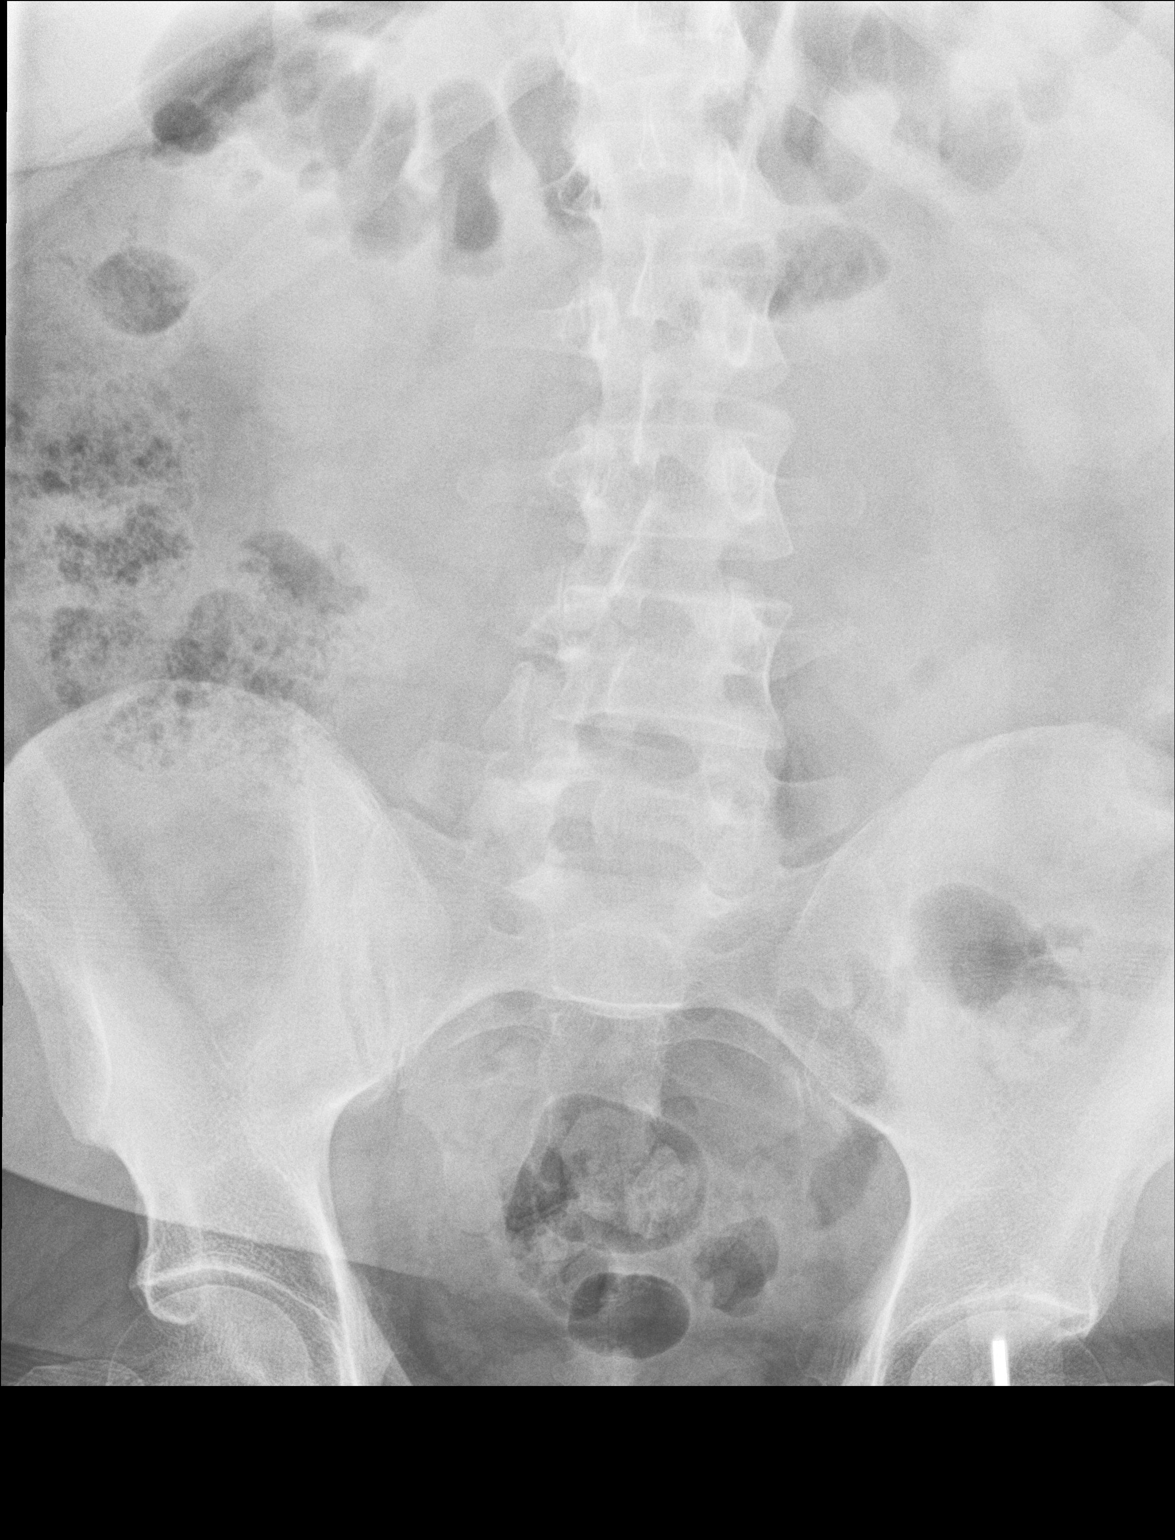

[3 of 3 positions shown; findings below may reference images not displayed]

FINDINGS: Supine and upright images obtained. There is moderate stool
throughout the colon. There is no bowel dilatation or air-fluid
level to suggest bowel obstruction. No free air. No abnormal
calcifications are evident.
IMPRESSION: Moderate stool throughout colon. No bowel obstruction or free air
evident.
